# Patient Record
Sex: Female | Born: 1950 | Race: White | Hispanic: No | Marital: Married | Smoking: Never smoker
Health system: Southern US, Community
[De-identification: ages and names within clinical notes are randomized; demographics above are authoritative.]

## PROBLEM LIST (undated history)

## (undated) DIAGNOSIS — F325 Major depressive disorder, single episode, in full remission: Secondary | ICD-10-CM

## (undated) DIAGNOSIS — I8390 Asymptomatic varicose veins of unspecified lower extremity: Secondary | ICD-10-CM

## (undated) DIAGNOSIS — D219 Benign neoplasm of connective and other soft tissue, unspecified: Secondary | ICD-10-CM

## (undated) DIAGNOSIS — R748 Abnormal levels of other serum enzymes: Secondary | ICD-10-CM

## (undated) DIAGNOSIS — B351 Tinea unguium: Secondary | ICD-10-CM

## (undated) DIAGNOSIS — N80109 Endometriosis of ovary, unspecified side, unspecified depth: Secondary | ICD-10-CM

## (undated) DIAGNOSIS — N952 Postmenopausal atrophic vaginitis: Secondary | ICD-10-CM

## (undated) DIAGNOSIS — E7800 Pure hypercholesterolemia, unspecified: Secondary | ICD-10-CM

## (undated) DIAGNOSIS — N801 Endometriosis of ovary: Secondary | ICD-10-CM

## (undated) DIAGNOSIS — IMO0002 Reserved for concepts with insufficient information to code with codable children: Secondary | ICD-10-CM

## (undated) HISTORY — DX: Pure hypercholesterolemia, unspecified: E78.00

## (undated) HISTORY — DX: Major depressive disorder, single episode, in full remission: F32.5

## (undated) HISTORY — PX: OTHER SURGICAL HISTORY: SHX169

## (undated) HISTORY — DX: Endometriosis of ovary: N80.1

## (undated) HISTORY — PX: ABDOMINAL HYSTERECTOMY: SHX81

## (undated) HISTORY — PX: BREAST BIOPSY: SHX20

## (undated) HISTORY — PX: COLONOSCOPY W/ POLYPECTOMY: SHX1380

## (undated) HISTORY — DX: Postmenopausal atrophic vaginitis: N95.2

## (undated) HISTORY — DX: Benign neoplasm of connective and other soft tissue, unspecified: D21.9

## (undated) HISTORY — DX: Abnormal levels of other serum enzymes: R74.8

## (undated) HISTORY — DX: Reserved for concepts with insufficient information to code with codable children: IMO0002

## (undated) HISTORY — DX: Endometriosis of ovary, unspecified side, unspecified depth: N80.109

## (undated) HISTORY — DX: Tinea unguium: B35.1

---

## 1987-02-15 HISTORY — PX: BREAST EXCISIONAL BIOPSY: SUR124

## 1999-11-18 ENCOUNTER — Encounter: Payer: Self-pay | Admitting: Unknown Physician Specialty

## 1999-11-18 ENCOUNTER — Encounter: Admission: RE | Admit: 1999-11-18 | Discharge: 1999-11-18 | Payer: Self-pay | Admitting: Unknown Physician Specialty

## 2000-12-05 ENCOUNTER — Encounter: Payer: Self-pay | Admitting: Unknown Physician Specialty

## 2000-12-05 ENCOUNTER — Encounter: Admission: RE | Admit: 2000-12-05 | Discharge: 2000-12-05 | Payer: Self-pay | Admitting: Unknown Physician Specialty

## 2000-12-08 ENCOUNTER — Encounter: Admission: RE | Admit: 2000-12-08 | Discharge: 2000-12-08 | Payer: Self-pay | Admitting: Unknown Physician Specialty

## 2000-12-08 ENCOUNTER — Encounter: Payer: Self-pay | Admitting: Unknown Physician Specialty

## 2001-12-20 ENCOUNTER — Encounter: Admission: RE | Admit: 2001-12-20 | Discharge: 2001-12-20 | Payer: Self-pay | Admitting: Unknown Physician Specialty

## 2001-12-20 ENCOUNTER — Encounter: Payer: Self-pay | Admitting: Unknown Physician Specialty

## 2003-01-20 ENCOUNTER — Encounter: Admission: RE | Admit: 2003-01-20 | Discharge: 2003-01-20 | Payer: Self-pay | Admitting: Unknown Physician Specialty

## 2004-08-04 ENCOUNTER — Encounter: Admission: RE | Admit: 2004-08-04 | Discharge: 2004-08-04 | Payer: Self-pay | Admitting: Unknown Physician Specialty

## 2005-09-21 ENCOUNTER — Encounter: Admission: RE | Admit: 2005-09-21 | Discharge: 2005-09-21 | Payer: Self-pay | Admitting: Unknown Physician Specialty

## 2006-02-14 HISTORY — PX: BREAST CYST ASPIRATION: SHX578

## 2007-03-15 ENCOUNTER — Encounter: Admission: RE | Admit: 2007-03-15 | Discharge: 2007-03-15 | Payer: Self-pay | Admitting: Unknown Physician Specialty

## 2007-03-23 ENCOUNTER — Encounter: Admission: RE | Admit: 2007-03-23 | Discharge: 2007-03-23 | Payer: Self-pay | Admitting: Unknown Physician Specialty

## 2007-03-23 ENCOUNTER — Encounter (INDEPENDENT_AMBULATORY_CARE_PROVIDER_SITE_OTHER): Payer: Self-pay | Admitting: Diagnostic Radiology

## 2007-06-11 ENCOUNTER — Ambulatory Visit: Payer: Self-pay | Admitting: Unknown Physician Specialty

## 2007-10-03 ENCOUNTER — Encounter: Admission: RE | Admit: 2007-10-03 | Discharge: 2007-10-03 | Payer: Self-pay | Admitting: Unknown Physician Specialty

## 2008-01-12 ENCOUNTER — Emergency Department: Payer: Self-pay | Admitting: Emergency Medicine

## 2009-05-11 ENCOUNTER — Ambulatory Visit: Payer: Self-pay | Admitting: Family Medicine

## 2009-06-03 ENCOUNTER — Emergency Department: Payer: Self-pay | Admitting: Emergency Medicine

## 2010-04-28 ENCOUNTER — Ambulatory Visit: Payer: Self-pay | Admitting: Obstetrics and Gynecology

## 2011-06-07 ENCOUNTER — Ambulatory Visit: Payer: Self-pay | Admitting: Obstetrics and Gynecology

## 2011-09-28 ENCOUNTER — Ambulatory Visit: Payer: Self-pay | Admitting: Unknown Physician Specialty

## 2011-09-29 LAB — PATHOLOGY REPORT

## 2011-10-26 ENCOUNTER — Ambulatory Visit: Payer: Self-pay | Admitting: Unknown Physician Specialty

## 2012-02-15 HISTORY — PX: COLONOSCOPY: SHX174

## 2012-06-12 ENCOUNTER — Ambulatory Visit: Payer: Self-pay | Admitting: Obstetrics and Gynecology

## 2012-11-01 ENCOUNTER — Ambulatory Visit: Payer: Self-pay | Admitting: Unknown Physician Specialty

## 2013-01-04 ENCOUNTER — Ambulatory Visit: Payer: Self-pay | Admitting: Family Medicine

## 2013-07-02 ENCOUNTER — Ambulatory Visit: Payer: Self-pay | Admitting: Family Medicine

## 2013-10-14 DIAGNOSIS — M5137 Other intervertebral disc degeneration, lumbosacral region: Secondary | ICD-10-CM | POA: Diagnosis not present

## 2013-10-14 DIAGNOSIS — M999 Biomechanical lesion, unspecified: Secondary | ICD-10-CM | POA: Diagnosis not present

## 2013-10-25 DIAGNOSIS — M542 Cervicalgia: Secondary | ICD-10-CM | POA: Diagnosis not present

## 2013-10-25 DIAGNOSIS — M545 Low back pain, unspecified: Secondary | ICD-10-CM | POA: Diagnosis not present

## 2013-10-25 DIAGNOSIS — M999 Biomechanical lesion, unspecified: Secondary | ICD-10-CM | POA: Diagnosis not present

## 2013-10-25 DIAGNOSIS — M546 Pain in thoracic spine: Secondary | ICD-10-CM | POA: Diagnosis not present

## 2013-10-25 DIAGNOSIS — M9981 Other biomechanical lesions of cervical region: Secondary | ICD-10-CM | POA: Diagnosis not present

## 2013-10-28 DIAGNOSIS — M546 Pain in thoracic spine: Secondary | ICD-10-CM | POA: Diagnosis not present

## 2013-10-28 DIAGNOSIS — M545 Low back pain, unspecified: Secondary | ICD-10-CM | POA: Diagnosis not present

## 2013-10-28 DIAGNOSIS — M9981 Other biomechanical lesions of cervical region: Secondary | ICD-10-CM | POA: Diagnosis not present

## 2013-10-28 DIAGNOSIS — M999 Biomechanical lesion, unspecified: Secondary | ICD-10-CM | POA: Diagnosis not present

## 2013-10-28 DIAGNOSIS — M542 Cervicalgia: Secondary | ICD-10-CM | POA: Diagnosis not present

## 2013-10-30 DIAGNOSIS — M545 Low back pain, unspecified: Secondary | ICD-10-CM | POA: Diagnosis not present

## 2013-10-30 DIAGNOSIS — M542 Cervicalgia: Secondary | ICD-10-CM | POA: Diagnosis not present

## 2013-10-30 DIAGNOSIS — M546 Pain in thoracic spine: Secondary | ICD-10-CM | POA: Diagnosis not present

## 2013-10-30 DIAGNOSIS — M999 Biomechanical lesion, unspecified: Secondary | ICD-10-CM | POA: Diagnosis not present

## 2013-10-30 DIAGNOSIS — M9981 Other biomechanical lesions of cervical region: Secondary | ICD-10-CM | POA: Diagnosis not present

## 2013-11-04 DIAGNOSIS — M546 Pain in thoracic spine: Secondary | ICD-10-CM | POA: Diagnosis not present

## 2013-11-04 DIAGNOSIS — M999 Biomechanical lesion, unspecified: Secondary | ICD-10-CM | POA: Diagnosis not present

## 2013-11-04 DIAGNOSIS — M545 Low back pain, unspecified: Secondary | ICD-10-CM | POA: Diagnosis not present

## 2013-11-04 DIAGNOSIS — M9981 Other biomechanical lesions of cervical region: Secondary | ICD-10-CM | POA: Diagnosis not present

## 2013-11-04 DIAGNOSIS — M542 Cervicalgia: Secondary | ICD-10-CM | POA: Diagnosis not present

## 2013-11-06 DIAGNOSIS — M546 Pain in thoracic spine: Secondary | ICD-10-CM | POA: Diagnosis not present

## 2013-11-06 DIAGNOSIS — M999 Biomechanical lesion, unspecified: Secondary | ICD-10-CM | POA: Diagnosis not present

## 2013-11-06 DIAGNOSIS — M9981 Other biomechanical lesions of cervical region: Secondary | ICD-10-CM | POA: Diagnosis not present

## 2013-11-06 DIAGNOSIS — M545 Low back pain, unspecified: Secondary | ICD-10-CM | POA: Diagnosis not present

## 2013-11-06 DIAGNOSIS — M542 Cervicalgia: Secondary | ICD-10-CM | POA: Diagnosis not present

## 2013-11-11 DIAGNOSIS — M9981 Other biomechanical lesions of cervical region: Secondary | ICD-10-CM | POA: Diagnosis not present

## 2013-11-11 DIAGNOSIS — M999 Biomechanical lesion, unspecified: Secondary | ICD-10-CM | POA: Diagnosis not present

## 2013-11-11 DIAGNOSIS — M545 Low back pain, unspecified: Secondary | ICD-10-CM | POA: Diagnosis not present

## 2013-11-11 DIAGNOSIS — M542 Cervicalgia: Secondary | ICD-10-CM | POA: Diagnosis not present

## 2013-11-11 DIAGNOSIS — M546 Pain in thoracic spine: Secondary | ICD-10-CM | POA: Diagnosis not present

## 2013-11-13 DIAGNOSIS — M545 Low back pain, unspecified: Secondary | ICD-10-CM | POA: Diagnosis not present

## 2013-11-13 DIAGNOSIS — M999 Biomechanical lesion, unspecified: Secondary | ICD-10-CM | POA: Diagnosis not present

## 2013-11-13 DIAGNOSIS — M9981 Other biomechanical lesions of cervical region: Secondary | ICD-10-CM | POA: Diagnosis not present

## 2013-11-13 DIAGNOSIS — M546 Pain in thoracic spine: Secondary | ICD-10-CM | POA: Diagnosis not present

## 2013-11-13 DIAGNOSIS — M542 Cervicalgia: Secondary | ICD-10-CM | POA: Diagnosis not present

## 2013-11-18 DIAGNOSIS — M546 Pain in thoracic spine: Secondary | ICD-10-CM | POA: Diagnosis not present

## 2013-11-18 DIAGNOSIS — M9901 Segmental and somatic dysfunction of cervical region: Secondary | ICD-10-CM | POA: Diagnosis not present

## 2013-11-18 DIAGNOSIS — M9902 Segmental and somatic dysfunction of thoracic region: Secondary | ICD-10-CM | POA: Diagnosis not present

## 2013-11-18 DIAGNOSIS — M545 Low back pain: Secondary | ICD-10-CM | POA: Diagnosis not present

## 2013-11-18 DIAGNOSIS — M542 Cervicalgia: Secondary | ICD-10-CM | POA: Diagnosis not present

## 2013-11-18 DIAGNOSIS — M9903 Segmental and somatic dysfunction of lumbar region: Secondary | ICD-10-CM | POA: Diagnosis not present

## 2013-11-20 DIAGNOSIS — M546 Pain in thoracic spine: Secondary | ICD-10-CM | POA: Diagnosis not present

## 2013-11-20 DIAGNOSIS — M9901 Segmental and somatic dysfunction of cervical region: Secondary | ICD-10-CM | POA: Diagnosis not present

## 2013-11-20 DIAGNOSIS — M545 Low back pain: Secondary | ICD-10-CM | POA: Diagnosis not present

## 2013-11-20 DIAGNOSIS — M9903 Segmental and somatic dysfunction of lumbar region: Secondary | ICD-10-CM | POA: Diagnosis not present

## 2013-11-20 DIAGNOSIS — M542 Cervicalgia: Secondary | ICD-10-CM | POA: Diagnosis not present

## 2013-11-20 DIAGNOSIS — M9902 Segmental and somatic dysfunction of thoracic region: Secondary | ICD-10-CM | POA: Diagnosis not present

## 2013-11-22 DIAGNOSIS — M9903 Segmental and somatic dysfunction of lumbar region: Secondary | ICD-10-CM | POA: Diagnosis not present

## 2013-11-22 DIAGNOSIS — M545 Low back pain: Secondary | ICD-10-CM | POA: Diagnosis not present

## 2013-11-22 DIAGNOSIS — M546 Pain in thoracic spine: Secondary | ICD-10-CM | POA: Diagnosis not present

## 2013-11-22 DIAGNOSIS — M9901 Segmental and somatic dysfunction of cervical region: Secondary | ICD-10-CM | POA: Diagnosis not present

## 2013-11-22 DIAGNOSIS — M542 Cervicalgia: Secondary | ICD-10-CM | POA: Diagnosis not present

## 2013-11-22 DIAGNOSIS — M9902 Segmental and somatic dysfunction of thoracic region: Secondary | ICD-10-CM | POA: Diagnosis not present

## 2013-11-25 DIAGNOSIS — M9902 Segmental and somatic dysfunction of thoracic region: Secondary | ICD-10-CM | POA: Diagnosis not present

## 2013-11-25 DIAGNOSIS — M9903 Segmental and somatic dysfunction of lumbar region: Secondary | ICD-10-CM | POA: Diagnosis not present

## 2013-11-25 DIAGNOSIS — M545 Low back pain: Secondary | ICD-10-CM | POA: Diagnosis not present

## 2013-11-25 DIAGNOSIS — M546 Pain in thoracic spine: Secondary | ICD-10-CM | POA: Diagnosis not present

## 2013-11-25 DIAGNOSIS — M9901 Segmental and somatic dysfunction of cervical region: Secondary | ICD-10-CM | POA: Diagnosis not present

## 2013-11-25 DIAGNOSIS — M542 Cervicalgia: Secondary | ICD-10-CM | POA: Diagnosis not present

## 2013-11-29 DIAGNOSIS — M545 Low back pain: Secondary | ICD-10-CM | POA: Diagnosis not present

## 2013-11-29 DIAGNOSIS — M546 Pain in thoracic spine: Secondary | ICD-10-CM | POA: Diagnosis not present

## 2013-11-29 DIAGNOSIS — M9901 Segmental and somatic dysfunction of cervical region: Secondary | ICD-10-CM | POA: Diagnosis not present

## 2013-11-29 DIAGNOSIS — M9903 Segmental and somatic dysfunction of lumbar region: Secondary | ICD-10-CM | POA: Diagnosis not present

## 2013-11-29 DIAGNOSIS — M542 Cervicalgia: Secondary | ICD-10-CM | POA: Diagnosis not present

## 2013-11-29 DIAGNOSIS — M9902 Segmental and somatic dysfunction of thoracic region: Secondary | ICD-10-CM | POA: Diagnosis not present

## 2013-12-02 DIAGNOSIS — M542 Cervicalgia: Secondary | ICD-10-CM | POA: Diagnosis not present

## 2013-12-02 DIAGNOSIS — M9901 Segmental and somatic dysfunction of cervical region: Secondary | ICD-10-CM | POA: Diagnosis not present

## 2013-12-02 DIAGNOSIS — M9903 Segmental and somatic dysfunction of lumbar region: Secondary | ICD-10-CM | POA: Diagnosis not present

## 2013-12-02 DIAGNOSIS — M9902 Segmental and somatic dysfunction of thoracic region: Secondary | ICD-10-CM | POA: Diagnosis not present

## 2013-12-02 DIAGNOSIS — M545 Low back pain: Secondary | ICD-10-CM | POA: Diagnosis not present

## 2013-12-02 DIAGNOSIS — M546 Pain in thoracic spine: Secondary | ICD-10-CM | POA: Diagnosis not present

## 2013-12-04 DIAGNOSIS — M9902 Segmental and somatic dysfunction of thoracic region: Secondary | ICD-10-CM | POA: Diagnosis not present

## 2013-12-04 DIAGNOSIS — M545 Low back pain: Secondary | ICD-10-CM | POA: Diagnosis not present

## 2013-12-04 DIAGNOSIS — M9903 Segmental and somatic dysfunction of lumbar region: Secondary | ICD-10-CM | POA: Diagnosis not present

## 2013-12-04 DIAGNOSIS — M546 Pain in thoracic spine: Secondary | ICD-10-CM | POA: Diagnosis not present

## 2013-12-04 DIAGNOSIS — M9901 Segmental and somatic dysfunction of cervical region: Secondary | ICD-10-CM | POA: Diagnosis not present

## 2013-12-04 DIAGNOSIS — M542 Cervicalgia: Secondary | ICD-10-CM | POA: Diagnosis not present

## 2013-12-09 DIAGNOSIS — M9902 Segmental and somatic dysfunction of thoracic region: Secondary | ICD-10-CM | POA: Diagnosis not present

## 2013-12-09 DIAGNOSIS — M9903 Segmental and somatic dysfunction of lumbar region: Secondary | ICD-10-CM | POA: Diagnosis not present

## 2013-12-09 DIAGNOSIS — M542 Cervicalgia: Secondary | ICD-10-CM | POA: Diagnosis not present

## 2013-12-09 DIAGNOSIS — M546 Pain in thoracic spine: Secondary | ICD-10-CM | POA: Diagnosis not present

## 2013-12-09 DIAGNOSIS — M9901 Segmental and somatic dysfunction of cervical region: Secondary | ICD-10-CM | POA: Diagnosis not present

## 2013-12-09 DIAGNOSIS — M545 Low back pain: Secondary | ICD-10-CM | POA: Diagnosis not present

## 2013-12-11 DIAGNOSIS — M9902 Segmental and somatic dysfunction of thoracic region: Secondary | ICD-10-CM | POA: Diagnosis not present

## 2013-12-11 DIAGNOSIS — M546 Pain in thoracic spine: Secondary | ICD-10-CM | POA: Diagnosis not present

## 2013-12-11 DIAGNOSIS — M542 Cervicalgia: Secondary | ICD-10-CM | POA: Diagnosis not present

## 2013-12-11 DIAGNOSIS — M545 Low back pain: Secondary | ICD-10-CM | POA: Diagnosis not present

## 2013-12-11 DIAGNOSIS — M9901 Segmental and somatic dysfunction of cervical region: Secondary | ICD-10-CM | POA: Diagnosis not present

## 2013-12-11 DIAGNOSIS — M9903 Segmental and somatic dysfunction of lumbar region: Secondary | ICD-10-CM | POA: Diagnosis not present

## 2013-12-13 DIAGNOSIS — M9902 Segmental and somatic dysfunction of thoracic region: Secondary | ICD-10-CM | POA: Diagnosis not present

## 2013-12-13 DIAGNOSIS — M9903 Segmental and somatic dysfunction of lumbar region: Secondary | ICD-10-CM | POA: Diagnosis not present

## 2013-12-13 DIAGNOSIS — M542 Cervicalgia: Secondary | ICD-10-CM | POA: Diagnosis not present

## 2013-12-13 DIAGNOSIS — M545 Low back pain: Secondary | ICD-10-CM | POA: Diagnosis not present

## 2013-12-13 DIAGNOSIS — M9901 Segmental and somatic dysfunction of cervical region: Secondary | ICD-10-CM | POA: Diagnosis not present

## 2013-12-13 DIAGNOSIS — M546 Pain in thoracic spine: Secondary | ICD-10-CM | POA: Diagnosis not present

## 2013-12-16 DIAGNOSIS — M542 Cervicalgia: Secondary | ICD-10-CM | POA: Diagnosis not present

## 2013-12-16 DIAGNOSIS — M9901 Segmental and somatic dysfunction of cervical region: Secondary | ICD-10-CM | POA: Diagnosis not present

## 2013-12-16 DIAGNOSIS — M545 Low back pain: Secondary | ICD-10-CM | POA: Diagnosis not present

## 2013-12-16 DIAGNOSIS — M9903 Segmental and somatic dysfunction of lumbar region: Secondary | ICD-10-CM | POA: Diagnosis not present

## 2013-12-16 DIAGNOSIS — M546 Pain in thoracic spine: Secondary | ICD-10-CM | POA: Diagnosis not present

## 2013-12-16 DIAGNOSIS — M9902 Segmental and somatic dysfunction of thoracic region: Secondary | ICD-10-CM | POA: Diagnosis not present

## 2013-12-20 DIAGNOSIS — M9902 Segmental and somatic dysfunction of thoracic region: Secondary | ICD-10-CM | POA: Diagnosis not present

## 2013-12-20 DIAGNOSIS — M542 Cervicalgia: Secondary | ICD-10-CM | POA: Diagnosis not present

## 2013-12-20 DIAGNOSIS — M546 Pain in thoracic spine: Secondary | ICD-10-CM | POA: Diagnosis not present

## 2013-12-20 DIAGNOSIS — M9901 Segmental and somatic dysfunction of cervical region: Secondary | ICD-10-CM | POA: Diagnosis not present

## 2013-12-20 DIAGNOSIS — M545 Low back pain: Secondary | ICD-10-CM | POA: Diagnosis not present

## 2013-12-20 DIAGNOSIS — M9903 Segmental and somatic dysfunction of lumbar region: Secondary | ICD-10-CM | POA: Diagnosis not present

## 2013-12-24 DIAGNOSIS — M9903 Segmental and somatic dysfunction of lumbar region: Secondary | ICD-10-CM | POA: Diagnosis not present

## 2013-12-24 DIAGNOSIS — M9901 Segmental and somatic dysfunction of cervical region: Secondary | ICD-10-CM | POA: Diagnosis not present

## 2013-12-24 DIAGNOSIS — M545 Low back pain: Secondary | ICD-10-CM | POA: Diagnosis not present

## 2013-12-24 DIAGNOSIS — M546 Pain in thoracic spine: Secondary | ICD-10-CM | POA: Diagnosis not present

## 2013-12-24 DIAGNOSIS — M9902 Segmental and somatic dysfunction of thoracic region: Secondary | ICD-10-CM | POA: Diagnosis not present

## 2013-12-24 DIAGNOSIS — M542 Cervicalgia: Secondary | ICD-10-CM | POA: Diagnosis not present

## 2013-12-27 DIAGNOSIS — M542 Cervicalgia: Secondary | ICD-10-CM | POA: Diagnosis not present

## 2013-12-27 DIAGNOSIS — M9903 Segmental and somatic dysfunction of lumbar region: Secondary | ICD-10-CM | POA: Diagnosis not present

## 2013-12-27 DIAGNOSIS — M9901 Segmental and somatic dysfunction of cervical region: Secondary | ICD-10-CM | POA: Diagnosis not present

## 2013-12-27 DIAGNOSIS — M9902 Segmental and somatic dysfunction of thoracic region: Secondary | ICD-10-CM | POA: Diagnosis not present

## 2013-12-27 DIAGNOSIS — M546 Pain in thoracic spine: Secondary | ICD-10-CM | POA: Diagnosis not present

## 2013-12-27 DIAGNOSIS — M545 Low back pain: Secondary | ICD-10-CM | POA: Diagnosis not present

## 2013-12-31 DIAGNOSIS — M546 Pain in thoracic spine: Secondary | ICD-10-CM | POA: Diagnosis not present

## 2013-12-31 DIAGNOSIS — M545 Low back pain: Secondary | ICD-10-CM | POA: Diagnosis not present

## 2013-12-31 DIAGNOSIS — M9902 Segmental and somatic dysfunction of thoracic region: Secondary | ICD-10-CM | POA: Diagnosis not present

## 2013-12-31 DIAGNOSIS — M542 Cervicalgia: Secondary | ICD-10-CM | POA: Diagnosis not present

## 2013-12-31 DIAGNOSIS — M9903 Segmental and somatic dysfunction of lumbar region: Secondary | ICD-10-CM | POA: Diagnosis not present

## 2013-12-31 DIAGNOSIS — M9901 Segmental and somatic dysfunction of cervical region: Secondary | ICD-10-CM | POA: Diagnosis not present

## 2014-02-20 DIAGNOSIS — Z23 Encounter for immunization: Secondary | ICD-10-CM | POA: Diagnosis not present

## 2014-03-04 DIAGNOSIS — M545 Low back pain: Secondary | ICD-10-CM | POA: Diagnosis not present

## 2014-03-04 DIAGNOSIS — M9903 Segmental and somatic dysfunction of lumbar region: Secondary | ICD-10-CM | POA: Diagnosis not present

## 2014-03-04 DIAGNOSIS — M9901 Segmental and somatic dysfunction of cervical region: Secondary | ICD-10-CM | POA: Diagnosis not present

## 2014-03-04 DIAGNOSIS — M542 Cervicalgia: Secondary | ICD-10-CM | POA: Diagnosis not present

## 2014-03-04 DIAGNOSIS — M9902 Segmental and somatic dysfunction of thoracic region: Secondary | ICD-10-CM | POA: Diagnosis not present

## 2014-03-04 DIAGNOSIS — M546 Pain in thoracic spine: Secondary | ICD-10-CM | POA: Diagnosis not present

## 2014-03-25 DIAGNOSIS — M9901 Segmental and somatic dysfunction of cervical region: Secondary | ICD-10-CM | POA: Diagnosis not present

## 2014-03-25 DIAGNOSIS — M9902 Segmental and somatic dysfunction of thoracic region: Secondary | ICD-10-CM | POA: Diagnosis not present

## 2014-03-25 DIAGNOSIS — M9903 Segmental and somatic dysfunction of lumbar region: Secondary | ICD-10-CM | POA: Diagnosis not present

## 2014-03-25 DIAGNOSIS — M546 Pain in thoracic spine: Secondary | ICD-10-CM | POA: Diagnosis not present

## 2014-03-25 DIAGNOSIS — M545 Low back pain: Secondary | ICD-10-CM | POA: Diagnosis not present

## 2014-03-25 DIAGNOSIS — M542 Cervicalgia: Secondary | ICD-10-CM | POA: Diagnosis not present

## 2014-03-27 DIAGNOSIS — L609 Nail disorder, unspecified: Secondary | ICD-10-CM | POA: Diagnosis not present

## 2014-03-27 DIAGNOSIS — R748 Abnormal levels of other serum enzymes: Secondary | ICD-10-CM | POA: Diagnosis not present

## 2014-03-27 DIAGNOSIS — K824 Cholesterolosis of gallbladder: Secondary | ICD-10-CM | POA: Diagnosis not present

## 2014-03-27 DIAGNOSIS — B354 Tinea corporis: Secondary | ICD-10-CM | POA: Diagnosis not present

## 2014-03-27 DIAGNOSIS — B351 Tinea unguium: Secondary | ICD-10-CM | POA: Diagnosis not present

## 2014-03-27 DIAGNOSIS — E780 Pure hypercholesterolemia: Secondary | ICD-10-CM | POA: Diagnosis not present

## 2014-03-27 DIAGNOSIS — R1907 Generalized intra-abdominal and pelvic swelling, mass and lump: Secondary | ICD-10-CM | POA: Diagnosis not present

## 2014-04-14 DIAGNOSIS — M9902 Segmental and somatic dysfunction of thoracic region: Secondary | ICD-10-CM | POA: Diagnosis not present

## 2014-04-14 DIAGNOSIS — M546 Pain in thoracic spine: Secondary | ICD-10-CM | POA: Diagnosis not present

## 2014-04-14 DIAGNOSIS — M545 Low back pain: Secondary | ICD-10-CM | POA: Diagnosis not present

## 2014-04-14 DIAGNOSIS — M9901 Segmental and somatic dysfunction of cervical region: Secondary | ICD-10-CM | POA: Diagnosis not present

## 2014-04-14 DIAGNOSIS — M542 Cervicalgia: Secondary | ICD-10-CM | POA: Diagnosis not present

## 2014-04-14 DIAGNOSIS — M9903 Segmental and somatic dysfunction of lumbar region: Secondary | ICD-10-CM | POA: Diagnosis not present

## 2014-04-15 ENCOUNTER — Ambulatory Visit: Payer: Self-pay | Admitting: Family Medicine

## 2014-04-15 DIAGNOSIS — K253 Acute gastric ulcer without hemorrhage or perforation: Secondary | ICD-10-CM | POA: Diagnosis not present

## 2014-04-15 DIAGNOSIS — I728 Aneurysm of other specified arteries: Secondary | ICD-10-CM | POA: Diagnosis not present

## 2014-04-15 DIAGNOSIS — R1907 Generalized intra-abdominal and pelvic swelling, mass and lump: Secondary | ICD-10-CM | POA: Diagnosis not present

## 2014-04-15 DIAGNOSIS — N200 Calculus of kidney: Secondary | ICD-10-CM | POA: Diagnosis not present

## 2014-04-18 DIAGNOSIS — M542 Cervicalgia: Secondary | ICD-10-CM | POA: Diagnosis not present

## 2014-04-18 DIAGNOSIS — M545 Low back pain: Secondary | ICD-10-CM | POA: Diagnosis not present

## 2014-04-18 DIAGNOSIS — M9903 Segmental and somatic dysfunction of lumbar region: Secondary | ICD-10-CM | POA: Diagnosis not present

## 2014-04-18 DIAGNOSIS — M9901 Segmental and somatic dysfunction of cervical region: Secondary | ICD-10-CM | POA: Diagnosis not present

## 2014-04-18 DIAGNOSIS — M9902 Segmental and somatic dysfunction of thoracic region: Secondary | ICD-10-CM | POA: Diagnosis not present

## 2014-04-18 DIAGNOSIS — M546 Pain in thoracic spine: Secondary | ICD-10-CM | POA: Diagnosis not present

## 2014-04-21 DIAGNOSIS — M545 Low back pain: Secondary | ICD-10-CM | POA: Diagnosis not present

## 2014-04-21 DIAGNOSIS — M9903 Segmental and somatic dysfunction of lumbar region: Secondary | ICD-10-CM | POA: Diagnosis not present

## 2014-04-21 DIAGNOSIS — M9901 Segmental and somatic dysfunction of cervical region: Secondary | ICD-10-CM | POA: Diagnosis not present

## 2014-04-21 DIAGNOSIS — M546 Pain in thoracic spine: Secondary | ICD-10-CM | POA: Diagnosis not present

## 2014-04-21 DIAGNOSIS — M542 Cervicalgia: Secondary | ICD-10-CM | POA: Diagnosis not present

## 2014-04-21 DIAGNOSIS — M9902 Segmental and somatic dysfunction of thoracic region: Secondary | ICD-10-CM | POA: Diagnosis not present

## 2014-04-28 DIAGNOSIS — M545 Low back pain: Secondary | ICD-10-CM | POA: Diagnosis not present

## 2014-04-28 DIAGNOSIS — M542 Cervicalgia: Secondary | ICD-10-CM | POA: Diagnosis not present

## 2014-04-28 DIAGNOSIS — M9902 Segmental and somatic dysfunction of thoracic region: Secondary | ICD-10-CM | POA: Diagnosis not present

## 2014-04-28 DIAGNOSIS — M9903 Segmental and somatic dysfunction of lumbar region: Secondary | ICD-10-CM | POA: Diagnosis not present

## 2014-04-28 DIAGNOSIS — M546 Pain in thoracic spine: Secondary | ICD-10-CM | POA: Diagnosis not present

## 2014-04-28 DIAGNOSIS — M9901 Segmental and somatic dysfunction of cervical region: Secondary | ICD-10-CM | POA: Diagnosis not present

## 2014-04-29 DIAGNOSIS — B351 Tinea unguium: Secondary | ICD-10-CM | POA: Diagnosis not present

## 2014-04-30 DIAGNOSIS — M9903 Segmental and somatic dysfunction of lumbar region: Secondary | ICD-10-CM | POA: Diagnosis not present

## 2014-04-30 DIAGNOSIS — M9902 Segmental and somatic dysfunction of thoracic region: Secondary | ICD-10-CM | POA: Diagnosis not present

## 2014-04-30 DIAGNOSIS — M545 Low back pain: Secondary | ICD-10-CM | POA: Diagnosis not present

## 2014-04-30 DIAGNOSIS — M546 Pain in thoracic spine: Secondary | ICD-10-CM | POA: Diagnosis not present

## 2014-04-30 DIAGNOSIS — M9901 Segmental and somatic dysfunction of cervical region: Secondary | ICD-10-CM | POA: Diagnosis not present

## 2014-04-30 DIAGNOSIS — M542 Cervicalgia: Secondary | ICD-10-CM | POA: Diagnosis not present

## 2014-05-07 DIAGNOSIS — M546 Pain in thoracic spine: Secondary | ICD-10-CM | POA: Diagnosis not present

## 2014-05-07 DIAGNOSIS — M9902 Segmental and somatic dysfunction of thoracic region: Secondary | ICD-10-CM | POA: Diagnosis not present

## 2014-05-07 DIAGNOSIS — M9901 Segmental and somatic dysfunction of cervical region: Secondary | ICD-10-CM | POA: Diagnosis not present

## 2014-05-07 DIAGNOSIS — M9903 Segmental and somatic dysfunction of lumbar region: Secondary | ICD-10-CM | POA: Diagnosis not present

## 2014-05-07 DIAGNOSIS — M542 Cervicalgia: Secondary | ICD-10-CM | POA: Diagnosis not present

## 2014-05-07 DIAGNOSIS — M545 Low back pain: Secondary | ICD-10-CM | POA: Diagnosis not present

## 2014-05-13 DIAGNOSIS — M9903 Segmental and somatic dysfunction of lumbar region: Secondary | ICD-10-CM | POA: Diagnosis not present

## 2014-05-13 DIAGNOSIS — M9901 Segmental and somatic dysfunction of cervical region: Secondary | ICD-10-CM | POA: Diagnosis not present

## 2014-05-13 DIAGNOSIS — M9902 Segmental and somatic dysfunction of thoracic region: Secondary | ICD-10-CM | POA: Diagnosis not present

## 2014-05-13 DIAGNOSIS — M542 Cervicalgia: Secondary | ICD-10-CM | POA: Diagnosis not present

## 2014-05-13 DIAGNOSIS — M546 Pain in thoracic spine: Secondary | ICD-10-CM | POA: Diagnosis not present

## 2014-05-13 DIAGNOSIS — M545 Low back pain: Secondary | ICD-10-CM | POA: Diagnosis not present

## 2014-05-19 DIAGNOSIS — M9903 Segmental and somatic dysfunction of lumbar region: Secondary | ICD-10-CM | POA: Diagnosis not present

## 2014-05-19 DIAGNOSIS — M545 Low back pain: Secondary | ICD-10-CM | POA: Diagnosis not present

## 2014-05-19 DIAGNOSIS — M546 Pain in thoracic spine: Secondary | ICD-10-CM | POA: Diagnosis not present

## 2014-05-19 DIAGNOSIS — M542 Cervicalgia: Secondary | ICD-10-CM | POA: Diagnosis not present

## 2014-05-19 DIAGNOSIS — M9901 Segmental and somatic dysfunction of cervical region: Secondary | ICD-10-CM | POA: Diagnosis not present

## 2014-05-19 DIAGNOSIS — M9902 Segmental and somatic dysfunction of thoracic region: Secondary | ICD-10-CM | POA: Diagnosis not present

## 2014-05-23 DIAGNOSIS — M9903 Segmental and somatic dysfunction of lumbar region: Secondary | ICD-10-CM | POA: Diagnosis not present

## 2014-05-23 DIAGNOSIS — M545 Low back pain: Secondary | ICD-10-CM | POA: Diagnosis not present

## 2014-05-23 DIAGNOSIS — M9902 Segmental and somatic dysfunction of thoracic region: Secondary | ICD-10-CM | POA: Diagnosis not present

## 2014-05-23 DIAGNOSIS — M546 Pain in thoracic spine: Secondary | ICD-10-CM | POA: Diagnosis not present

## 2014-05-23 DIAGNOSIS — M9901 Segmental and somatic dysfunction of cervical region: Secondary | ICD-10-CM | POA: Diagnosis not present

## 2014-05-23 DIAGNOSIS — M542 Cervicalgia: Secondary | ICD-10-CM | POA: Diagnosis not present

## 2014-05-30 DIAGNOSIS — M9901 Segmental and somatic dysfunction of cervical region: Secondary | ICD-10-CM | POA: Diagnosis not present

## 2014-05-30 DIAGNOSIS — M546 Pain in thoracic spine: Secondary | ICD-10-CM | POA: Diagnosis not present

## 2014-05-30 DIAGNOSIS — M542 Cervicalgia: Secondary | ICD-10-CM | POA: Diagnosis not present

## 2014-05-30 DIAGNOSIS — M9902 Segmental and somatic dysfunction of thoracic region: Secondary | ICD-10-CM | POA: Diagnosis not present

## 2014-05-30 DIAGNOSIS — M545 Low back pain: Secondary | ICD-10-CM | POA: Diagnosis not present

## 2014-05-30 DIAGNOSIS — M9903 Segmental and somatic dysfunction of lumbar region: Secondary | ICD-10-CM | POA: Diagnosis not present

## 2014-06-09 DIAGNOSIS — M9902 Segmental and somatic dysfunction of thoracic region: Secondary | ICD-10-CM | POA: Diagnosis not present

## 2014-06-09 DIAGNOSIS — M545 Low back pain: Secondary | ICD-10-CM | POA: Diagnosis not present

## 2014-06-09 DIAGNOSIS — M9903 Segmental and somatic dysfunction of lumbar region: Secondary | ICD-10-CM | POA: Diagnosis not present

## 2014-06-09 DIAGNOSIS — Z79899 Other long term (current) drug therapy: Secondary | ICD-10-CM | POA: Diagnosis not present

## 2014-06-09 DIAGNOSIS — M546 Pain in thoracic spine: Secondary | ICD-10-CM | POA: Diagnosis not present

## 2014-06-09 DIAGNOSIS — M9901 Segmental and somatic dysfunction of cervical region: Secondary | ICD-10-CM | POA: Diagnosis not present

## 2014-06-09 DIAGNOSIS — M542 Cervicalgia: Secondary | ICD-10-CM | POA: Diagnosis not present

## 2014-06-10 DIAGNOSIS — N390 Urinary tract infection, site not specified: Secondary | ICD-10-CM | POA: Diagnosis not present

## 2014-06-16 DIAGNOSIS — M9901 Segmental and somatic dysfunction of cervical region: Secondary | ICD-10-CM | POA: Diagnosis not present

## 2014-06-16 DIAGNOSIS — M542 Cervicalgia: Secondary | ICD-10-CM | POA: Diagnosis not present

## 2014-06-16 DIAGNOSIS — M546 Pain in thoracic spine: Secondary | ICD-10-CM | POA: Diagnosis not present

## 2014-06-16 DIAGNOSIS — M9902 Segmental and somatic dysfunction of thoracic region: Secondary | ICD-10-CM | POA: Diagnosis not present

## 2014-06-16 DIAGNOSIS — M9903 Segmental and somatic dysfunction of lumbar region: Secondary | ICD-10-CM | POA: Diagnosis not present

## 2014-06-16 DIAGNOSIS — M545 Low back pain: Secondary | ICD-10-CM | POA: Diagnosis not present

## 2014-06-23 DIAGNOSIS — M546 Pain in thoracic spine: Secondary | ICD-10-CM | POA: Diagnosis not present

## 2014-06-23 DIAGNOSIS — M9902 Segmental and somatic dysfunction of thoracic region: Secondary | ICD-10-CM | POA: Diagnosis not present

## 2014-06-23 DIAGNOSIS — M542 Cervicalgia: Secondary | ICD-10-CM | POA: Diagnosis not present

## 2014-06-23 DIAGNOSIS — M545 Low back pain: Secondary | ICD-10-CM | POA: Diagnosis not present

## 2014-06-23 DIAGNOSIS — M9901 Segmental and somatic dysfunction of cervical region: Secondary | ICD-10-CM | POA: Diagnosis not present

## 2014-06-23 DIAGNOSIS — M9903 Segmental and somatic dysfunction of lumbar region: Secondary | ICD-10-CM | POA: Diagnosis not present

## 2014-06-30 DIAGNOSIS — M542 Cervicalgia: Secondary | ICD-10-CM | POA: Diagnosis not present

## 2014-06-30 DIAGNOSIS — M545 Low back pain: Secondary | ICD-10-CM | POA: Diagnosis not present

## 2014-06-30 DIAGNOSIS — M9903 Segmental and somatic dysfunction of lumbar region: Secondary | ICD-10-CM | POA: Diagnosis not present

## 2014-06-30 DIAGNOSIS — M9902 Segmental and somatic dysfunction of thoracic region: Secondary | ICD-10-CM | POA: Diagnosis not present

## 2014-06-30 DIAGNOSIS — M546 Pain in thoracic spine: Secondary | ICD-10-CM | POA: Diagnosis not present

## 2014-06-30 DIAGNOSIS — M9901 Segmental and somatic dysfunction of cervical region: Secondary | ICD-10-CM | POA: Diagnosis not present

## 2014-07-07 DIAGNOSIS — M9901 Segmental and somatic dysfunction of cervical region: Secondary | ICD-10-CM | POA: Diagnosis not present

## 2014-07-07 DIAGNOSIS — M546 Pain in thoracic spine: Secondary | ICD-10-CM | POA: Diagnosis not present

## 2014-07-07 DIAGNOSIS — M545 Low back pain: Secondary | ICD-10-CM | POA: Diagnosis not present

## 2014-07-07 DIAGNOSIS — M9902 Segmental and somatic dysfunction of thoracic region: Secondary | ICD-10-CM | POA: Diagnosis not present

## 2014-07-07 DIAGNOSIS — M542 Cervicalgia: Secondary | ICD-10-CM | POA: Diagnosis not present

## 2014-07-07 DIAGNOSIS — M9903 Segmental and somatic dysfunction of lumbar region: Secondary | ICD-10-CM | POA: Diagnosis not present

## 2014-07-16 DIAGNOSIS — M545 Low back pain: Secondary | ICD-10-CM | POA: Diagnosis not present

## 2014-07-16 DIAGNOSIS — M9903 Segmental and somatic dysfunction of lumbar region: Secondary | ICD-10-CM | POA: Diagnosis not present

## 2014-07-16 DIAGNOSIS — M9901 Segmental and somatic dysfunction of cervical region: Secondary | ICD-10-CM | POA: Diagnosis not present

## 2014-07-16 DIAGNOSIS — M9902 Segmental and somatic dysfunction of thoracic region: Secondary | ICD-10-CM | POA: Diagnosis not present

## 2014-07-16 DIAGNOSIS — M542 Cervicalgia: Secondary | ICD-10-CM | POA: Diagnosis not present

## 2014-07-16 DIAGNOSIS — M546 Pain in thoracic spine: Secondary | ICD-10-CM | POA: Diagnosis not present

## 2014-07-17 ENCOUNTER — Other Ambulatory Visit: Payer: Self-pay | Admitting: Family Medicine

## 2014-07-17 ENCOUNTER — Encounter: Payer: Medicare Other | Admitting: Family Medicine

## 2014-07-17 DIAGNOSIS — Z1231 Encounter for screening mammogram for malignant neoplasm of breast: Secondary | ICD-10-CM

## 2014-07-17 NOTE — Progress Notes (Signed)
This encounter was created in error - please disregard.

## 2014-07-23 ENCOUNTER — Ambulatory Visit
Admission: RE | Admit: 2014-07-23 | Discharge: 2014-07-23 | Disposition: A | Discharge status: Home / Self Care | Payer: Medicare Other | Source: Ambulatory Visit | Attending: Family Medicine | Admitting: Family Medicine

## 2014-07-23 ENCOUNTER — Other Ambulatory Visit: Payer: Self-pay | Admitting: Family Medicine

## 2014-07-23 DIAGNOSIS — Z1231 Encounter for screening mammogram for malignant neoplasm of breast: Secondary | ICD-10-CM

## 2014-07-25 ENCOUNTER — Telehealth: Payer: Self-pay

## 2014-07-25 DIAGNOSIS — H2513 Age-related nuclear cataract, bilateral: Secondary | ICD-10-CM | POA: Diagnosis not present

## 2014-07-25 MED ORDER — FLUOXETINE HCL (PMDD) 10 MG PO TABS: 1.0000 | ORAL_TABLET | Freq: Every day | ORAL | Status: DC

## 2014-07-25 NOTE — Telephone Encounter (Signed)
Have her take 10 mg daily until I see her; make sure she has appt in the next week I put in new rx but there is no pharmacy; please send Seek immediate care if she feels overly depressed, has hallucinations, or develops dark thoughts or thoughts of hurting herself She was supposed to be seen June 2nd per the note in Programme researcher, broadcasting/film/video

## 2014-07-25 NOTE — Telephone Encounter (Signed)
Since starting back on the Fluoxetine, she is having side effects. Can't sleep, nervous, loss of appetite. She wants to know if she maybe should have started back at 10mg  instead of 75m?

## 2014-07-25 NOTE — Telephone Encounter (Signed)
Left detailed message for patient.

## 2014-07-30 DIAGNOSIS — M9903 Segmental and somatic dysfunction of lumbar region: Secondary | ICD-10-CM | POA: Diagnosis not present

## 2014-07-30 DIAGNOSIS — M542 Cervicalgia: Secondary | ICD-10-CM | POA: Diagnosis not present

## 2014-07-30 DIAGNOSIS — M9901 Segmental and somatic dysfunction of cervical region: Secondary | ICD-10-CM | POA: Diagnosis not present

## 2014-07-30 DIAGNOSIS — M545 Low back pain: Secondary | ICD-10-CM | POA: Diagnosis not present

## 2014-07-30 DIAGNOSIS — M9902 Segmental and somatic dysfunction of thoracic region: Secondary | ICD-10-CM | POA: Diagnosis not present

## 2014-07-30 DIAGNOSIS — M546 Pain in thoracic spine: Secondary | ICD-10-CM | POA: Diagnosis not present

## 2014-08-01 ENCOUNTER — Encounter: Payer: Self-pay | Admitting: Family Medicine

## 2014-08-01 ENCOUNTER — Ambulatory Visit (INDEPENDENT_AMBULATORY_CARE_PROVIDER_SITE_OTHER): Payer: Medicare Other | Admitting: Family Medicine

## 2014-08-01 VITALS — BP 148/79 | HR 87 | Temp 98.6°F | Ht 66.0 in | Wt 122.0 lb

## 2014-08-01 DIAGNOSIS — R1084 Generalized abdominal pain: Secondary | ICD-10-CM

## 2014-08-01 DIAGNOSIS — E7800 Pure hypercholesterolemia, unspecified: Secondary | ICD-10-CM | POA: Insufficient documentation

## 2014-08-01 DIAGNOSIS — F330 Major depressive disorder, recurrent, mild: Secondary | ICD-10-CM | POA: Diagnosis not present

## 2014-08-01 DIAGNOSIS — R634 Abnormal weight loss: Secondary | ICD-10-CM

## 2014-08-01 DIAGNOSIS — D7389 Other diseases of spleen: Secondary | ICD-10-CM

## 2014-08-01 MED ORDER — SERTRALINE HCL 50 MG PO TABS: 25.0000 mg | ORAL_TABLET | Freq: Every day | ORAL | Status: DC

## 2014-08-01 NOTE — Assessment & Plan Note (Signed)
On 20 mg fluoxetine for years, then off, then started back; now anxious; no hx of bipolar d/o; stop fluoxetine; start 25 mg sertraline, call before next appt if needed; return in 4-5 weeks

## 2014-08-01 NOTE — Addendum Note (Signed)
Addended byWende Mott J on: 08/01/2014 09:06 AM   Modules accepted: Orders, Medications, SmartSet

## 2014-08-01 NOTE — Assessment & Plan Note (Signed)
She has made changes in her diet and we'll check lipids and see how numbers compare

## 2014-08-01 NOTE — Assessment & Plan Note (Addendum)
rescan in early March 2017; discovered on CT scan earlier this year; I spoke with vascular doctor

## 2014-08-01 NOTE — Progress Notes (Signed)
BP 148/79 mmHg  Pulse 87  Temp(Src) 98.6 F (37 C)  Ht 5\' 6"  (1.676 m)  Wt 122 lb (55.339 kg)  BMI 19.70 kg/m2  SpO2 99%   Subjective:    Patient ID: Shannon Weber, female    DOB: [birth year], 65 y.o.   MRN: [id number]  HPI: Shannon Weber is a 64 y.o. female  Chief Complaint  Patient presents with  . Hyperlipidemia  . Depression    Patient is here for depression She had been on 20 mg of fluoxetine for years and then had come off, thinking she was going to be okay without it; off for about 3 months and then started to feel down again; no thoughts of hurting herself; back on 20 mg but was feeling like she was going to jump out of her skin, no appetite at all; almost like prednisone; still has some of that; dry mouth; she has been on 10 mg now for a few weeks; feels better, but different feeling; BP is always good and her BP is up; she has never been diagnosed with bipolar d/o; sleep is not okay; she wasn't sleep much at all when taking 20 mg and taking Benadryl to help sleep at night Depression screen [facility] 2/9 08/01/2014  Decreased Interest 1  Down, Depressed, Hopeless 0  PHQ - 2 Score 1   High cholesterol Runs in the family; she has never been on medicine and does not want to She went more toward vegetarian lifestyle; used to eat a lot of cheese and pretty much eliminated that; very little lean meat; lots of spinach, quinoa; here fasting for labs; walnuts on oatmeal every morning  Relevant past medical, surgical, family and social history reviewed and updated as indicated. Interim medical history since our last visit reviewed. Allergies and medications reviewed and updated.  Review of Systems  Constitutional: Positive for unexpected weight change (lost appetite completely when she started back on 20 mg SSRI and then made dietary changes as well).  HENT: Positive for voice change (dry mouth, from medicines; nonsmoker).   Respiratory: Negative for cough.   Gastrointestinal:  Positive for abdominal pain (nervous stomach pain connected to anxiety, since back on medicine). Negative for blood in stool.  Genitourinary: Negative for hematuria.  Hematological: Negative for adenopathy. Does not bruise/bleed easily.  Psychiatric/Behavioral: The patient is nervous/anxious.    Per HPI unless specifically indicated above Family Hx positive for goiter in mouth    Objective:    BP 148/79 mmHg  Pulse 87  Temp(Src) 98.6 F (37 C)  Ht 5\' 6"  (1.676 m)  Wt 122 lb (55.339 kg)  BMI 19.70 kg/m2  SpO2 99%  Wt Readings from Last 3 Encounters:  08/01/14 122 lb (55.339 kg)  06/10/14 131 lb (59.421 kg)    Physical Exam  Constitutional: She appears well-developed and well-nourished. No distress.  Weight loss noted  HENT:  Head: Normocephalic and atraumatic.  Eyes: EOM are normal. No scleral icterus.  Neck: No tracheal deviation present. No thyromegaly present.  Cardiovascular: Normal rate, regular rhythm and normal heart sounds.   No murmur heard. Pulmonary/Chest: Effort normal and breath sounds normal. No respiratory distress. She has no wheezes.  Abdominal: Soft. Bowel sounds are normal. She exhibits no distension and no mass. There is no guarding.  Musculoskeletal: Normal range of motion. She exhibits no edema.  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  Neurological: She is alert. She exhibits normal muscle tone.  Skin: Skin is warm and dry. She is not diaphoretic. No pallor.  Psychiatric: She has a normal mood and affect. Her behavior is normal. Judgment and thought content normal.      Assessment & Plan:   Problem List Items Addressed This Visit      Other   Major depressive disorder, recurrent episode, mild - Primary    On 20 mg fluoxetine for years, then off, then started back; now anxious; no hx of  bipolar d/o; stop fluoxetine; start 25 mg sertraline, call before next appt if needed; return in 4-5 weeks      Relevant Medications   sertraline (ZOLOFT) 50 MG tablet   High cholesterol    She has made changes in her diet and we'll check lipids and see how numbers compare      Relevant Orders   Lipid Panel w/o Chol/HDL Ratio   Comprehensive metabolic panel   Splenic calcification    rescan in early March 2017; discovered on CT scan earlier this year; I spoke with vascular doctor       Other Visit Diagnoses    Abnormal weight loss        patient made big diet changes for cholesterol, then couldn't eat with SSRI change; close f/u; check TSH    Relevant Orders    TSH    Generalized abdominal pain        possibly anxiety-related, serotonin receptors in gut discussed; call if symptoms worsen, do not improve on med change    Relevant Orders    Comprehensive metabolic panel       Meds ordered this encounter  Medications  . Probiotic Product ([facility] PO)    Sig: Take by mouth daily.  . sertraline (ZOLOFT) 50 MG tablet    Sig: Take 0.5 tablets (25 mg total) by mouth daily. STOP fluoxetine    Dispense:  15 tablet    Refill:  0    Follow up plan: Return in about 5 weeks (around 09/05/2014) for mood, weight.

## 2014-08-01 NOTE — Patient Instructions (Addendum)
If you have not heard anything from my staff in a week about any orders/referrals/studies entered today, please contact us here to follow-up Rescan the abdomen in early March of 2017; call us in mid-February to schedule that Keep up the good job with dietary changes Stop fluoxetine Start the sertraline If you don't feel significantly better in 2-3 weeks, or if you get worse sooner, let me know right away Your goal blood pressure is less than 150 mmHg Monitor blood pressure away from here and let me know if not under better control Try to follow the DASH guidelines (DASH stands for Dietary Approaches to Stop Hypertension) Try to limit the sodium in your diet.  Ideally, consume less than 1.5 grams (less than 1,500mg ) per day. Do not add salt when cooking or at the table.  Check the sodium amount on labels when shopping, and choose items lower in sodium when given a choice. Avoid or limit foods that already contain a lot of sodium. Eat a diet rich in fruits and vegetables and whole grains. Return in 4-5 weeks for f/u

## 2014-08-02 LAB — COMPREHENSIVE METABOLIC PANEL
ALT: 10 IU/L (ref 0–32)
AST: 19 IU/L (ref 0–40)
Albumin/Globulin Ratio: 1.6 (ref 1.1–2.5)
Albumin: 4.2 g/dL (ref 3.6–4.8)
Alkaline Phosphatase: 55 IU/L (ref 39–117)
BILIRUBIN TOTAL: 0.5 mg/dL (ref 0.0–1.2)
BUN / CREAT RATIO: 18 (ref 11–26)
BUN: 12 mg/dL (ref 8–27)
CO2: 25 mmol/L (ref 18–29)
CREATININE: 0.68 mg/dL (ref 0.57–1.00)
Calcium: 9.3 mg/dL (ref 8.7–10.3)
Chloride: 99 mmol/L (ref 97–108)
GFR calc non Af Amer: 92 mL/min/{1.73_m2} (ref >59)
GFR, EST AFRICAN AMERICAN: 106 mL/min/{1.73_m2} (ref >59)
Globulin, Total: 2.6 g/dL (ref 1.5–4.5)
Glucose: 94 mg/dL (ref 65–99)
Potassium: 3.9 mmol/L (ref 3.5–5.2)
SODIUM: 139 mmol/L (ref 134–144)
TOTAL PROTEIN: 6.8 g/dL (ref 6.0–8.5)

## 2014-08-02 LAB — LIPID PANEL W/O CHOL/HDL RATIO
CHOLESTEROL TOTAL: 208 mg/dL — AB (ref 100–199)
HDL: 66 mg/dL (ref >39)
LDL Calculated: 126 mg/dL — ABNORMAL HIGH (ref 0–99)
TRIGLYCERIDES: 82 mg/dL (ref 0–149)
VLDL Cholesterol Cal: 16 mg/dL (ref 5–40)

## 2014-08-02 LAB — TSH: TSH: 0.755 u[IU]/mL (ref 0.450–4.500)

## 2014-08-04 ENCOUNTER — Encounter: Payer: Self-pay | Admitting: Family Medicine

## 2014-08-11 ENCOUNTER — Other Ambulatory Visit: Payer: Self-pay

## 2014-08-29 ENCOUNTER — Ambulatory Visit (INDEPENDENT_AMBULATORY_CARE_PROVIDER_SITE_OTHER): Payer: Medicare Other | Admitting: Family Medicine

## 2014-08-29 ENCOUNTER — Encounter: Payer: Self-pay | Admitting: Family Medicine

## 2014-08-29 VITALS — BP 129/73 | HR 98 | Temp 97.8°F | Wt 122.0 lb

## 2014-08-29 DIAGNOSIS — Z1283 Encounter for screening for malignant neoplasm of skin: Secondary | ICD-10-CM

## 2014-08-29 DIAGNOSIS — Z23 Encounter for immunization: Secondary | ICD-10-CM | POA: Diagnosis not present

## 2014-08-29 DIAGNOSIS — F330 Major depressive disorder, recurrent, mild: Secondary | ICD-10-CM

## 2014-08-29 MED ORDER — TETANUS-DIPHTH-ACELL PERTUSSIS 5-2.5-18.5 LF-MCG/0.5 IM SUSP
0.5000 mL | Freq: Once | INTRAMUSCULAR | Status: AC
  Administered 2014-08-29: 0.5 mL via INTRAMUSCULAR

## 2014-08-29 MED ORDER — SERTRALINE HCL 50 MG PO TABS
25.0000 mg | ORAL_TABLET | Freq: Every day | ORAL | Status: DC
Start: 2014-08-29 — End: 2015-08-31

## 2014-08-29 NOTE — Progress Notes (Signed)
BP 129/73 mmHg  Pulse 98  Temp(Src) 97.8 F (36.6 C)  Wt 122 lb (55.339 kg)  SpO2 99%   Subjective:    Patient ID: Shannon Weber, female    DOB: March 05, 1950, 64 y.o.   MRN: [id number]  HPI: Shannon Weber is a 64 y.o. female  Chief Complaint  Patient presents with  . Depression   Feeling better; seen today for f/u for depression and anxiety; now on 25 mg of sertraline; she thinks her previous symptoms were from starting back on meds at too high a dose; much better now Sleeping better, but still some trouble falling and staying asleep during the night; benadryl has really helped Using chamomile tea; shakiness is better Eating better weight has stabilized She drinks caffeine, but none recently; previously she would only have half a cup of caffeinated coffee in the am She has no reason to think there is anything bad like cancer going on; if she continues to lose weight   Depression screen [facility] 2/9 08/29/2014 08/01/2014  Decreased Interest 1 1  Down, Depressed, Hopeless 1 0  PHQ - 2 Score 2 1   She asked about whooping cough vaccine; she cannot remember her last tetanus booster She would like to see a dermatologist; her father had skin cancer; she does not know about any worrisome moles or skin changes  Relevant past medical, surgical, family and social history reviewed and updated as indicated. Interim medical history since our last visit reviewed. Allergies and medications reviewed and updated.  Review of Systems  Per HPI unless specifically indicated above     Objective:    BP 129/73 mmHg  Pulse 98  Temp(Src) 97.8 F (36.6 C)  Wt 122 lb (55.339 kg)  SpO2 99%  Wt Readings from Last 3 Encounters:  08/29/14 122 lb (55.339 kg)  08/01/14 122 lb (55.339 kg)  06/10/14 131 lb (59.421 kg)    Physical Exam  Constitutional: She appears well-developed and well-nourished. No distress.  Weight stable  Eyes: No scleral icterus.  Cardiovascular: Normal rate.   Pulmonary/Chest:  Effort normal.  Abdominal: She exhibits no distension.  Psychiatric: She has a normal mood and affect. Her behavior is normal. Judgment and thought content normal.    Results for orders placed or performed in visit on 08/01/14  Lipid Panel w/o Chol/HDL Ratio  Result Value Ref Range   Cholesterol, Total 208 (H) 100 - 199 mg/dL   Triglycerides 82 0 - 149 mg/dL   HDL 66 >39 mg/dL   VLDL Cholesterol Cal 16 5 - 40 mg/dL   LDL Calculated 126 (H) 0 - 99 mg/dL  Comprehensive metabolic panel  Result Value Ref Range   Glucose 94 65 - 99 mg/dL   BUN 12 8 - 27 mg/dL   Creatinine, Ser 0.68 0.57 - 1.00 mg/dL   GFR calc non Af Amer 92 >59 mL/min/1.73   GFR calc Af Amer 106 >59 mL/min/1.73   BUN/Creatinine Ratio 18 11 - 26   Sodium 139 134 - 144 mmol/L   Potassium 3.9 3.5 - 5.2 mmol/L   Chloride 99 97 - 108 mmol/L   CO2 25 18 - 29 mmol/L   Calcium 9.3 8.7 - 10.3 mg/dL   Total Protein 6.8 6.0 - 8.5 g/dL   Albumin 4.2 3.6 - 4.8 g/dL   Globulin, Total 2.6 1.5 - 4.5 g/dL   Albumin/Globulin Ratio 1.6 1.1 - 2.5   Bilirubin Total 0.5 0.0 - 1.2 mg/dL   Alkaline Phosphatase 55  39 - 117 IU/L   AST 19 0 - 40 IU/L   ALT 10 0 - 32 IU/L  TSH  Result Value Ref Range   TSH 0.755 0.450 - 4.500 uIU/mL      Assessment & Plan:   Problem List Items Addressed This Visit      Other   Major depressive disorder, recurrent episode, mild - Primary    Much improved; continue SSRI indefinitely; refills sent      Relevant Medications   sertraline (ZOLOFT) 50 MG tablet    Other Visit Diagnoses    Screening exam for skin cancer        Relevant Orders    Ambulatory referral to Dermatology    Need for prophylactic vaccination with combined diphtheria-tetanus-pertussis (DTP) vaccine        given today    Relevant Medications    Tdap (BOOSTRIX) injection 0.5 mL (Completed)       Follow up plan: No Follow-up on file.

## 2014-08-29 NOTE — Assessment & Plan Note (Signed)
Much improved; continue SSRI indefinitely; refills sent

## 2014-08-29 NOTE — Patient Instructions (Signed)
I've put in a referral for you to see the skin doctor for a yearly skin check Do try to avoid sun exposure during the peak times of day between noon and 4 pm You received the tetanus-diphtheria-pertussis booster today The tetanus-diphtheria components will last up to ten years and then you should receive a booster The pertussis (whooping cough) component will last for the rest of your life and you will not need another booster for that per the current ACIP guidelines Return in 6 months, but call sooner if any problems

## 2014-11-24 DIAGNOSIS — B360 Pityriasis versicolor: Secondary | ICD-10-CM | POA: Diagnosis not present

## 2014-11-24 DIAGNOSIS — D1800 Hemangioma unspecified site: Secondary | ICD-10-CM | POA: Diagnosis not present

## 2014-11-24 DIAGNOSIS — Z1283 Encounter for screening for malignant neoplasm of skin: Secondary | ICD-10-CM | POA: Diagnosis not present

## 2014-11-24 DIAGNOSIS — L821 Other seborrheic keratosis: Secondary | ICD-10-CM | POA: Diagnosis not present

## 2014-11-24 DIAGNOSIS — D485 Neoplasm of uncertain behavior of skin: Secondary | ICD-10-CM | POA: Diagnosis not present

## 2014-11-24 DIAGNOSIS — D229 Melanocytic nevi, unspecified: Secondary | ICD-10-CM | POA: Diagnosis not present

## 2014-11-24 DIAGNOSIS — D225 Melanocytic nevi of trunk: Secondary | ICD-10-CM | POA: Diagnosis not present

## 2014-11-24 DIAGNOSIS — L812 Freckles: Secondary | ICD-10-CM | POA: Diagnosis not present

## 2014-11-26 ENCOUNTER — Telehealth: Payer: Self-pay | Admitting: Family Medicine

## 2014-11-26 NOTE — Telephone Encounter (Signed)
Unexplained wt loss, not sure if she should be concerned.  Please call her.

## 2014-11-26 NOTE — Telephone Encounter (Signed)
Yes, please Schedule an appointment for this, thank you

## 2014-11-26 NOTE — Telephone Encounter (Signed)
Dr. Sanda Klein, should she just schedule an appointment to be seen?

## 2014-12-01 ENCOUNTER — Ambulatory Visit (INDEPENDENT_AMBULATORY_CARE_PROVIDER_SITE_OTHER): Payer: Medicare Other | Admitting: Family Medicine

## 2014-12-01 ENCOUNTER — Encounter: Payer: Self-pay | Admitting: Family Medicine

## 2014-12-01 VITALS — BP 120/77 | HR 83 | Temp 99.3°F | Ht 66.0 in | Wt 122.6 lb

## 2014-12-01 DIAGNOSIS — R1032 Left lower quadrant pain: Secondary | ICD-10-CM

## 2014-12-01 DIAGNOSIS — R1031 Right lower quadrant pain: Secondary | ICD-10-CM | POA: Diagnosis not present

## 2014-12-01 DIAGNOSIS — R634 Abnormal weight loss: Secondary | ICD-10-CM | POA: Insufficient documentation

## 2014-12-01 DIAGNOSIS — R5383 Other fatigue: Secondary | ICD-10-CM | POA: Diagnosis not present

## 2014-12-01 NOTE — Progress Notes (Signed)
BP 120/77 mmHg  Pulse 83  Temp(Src) 99.3 F (37.4 C)  Ht 5\' 6"  (1.676 m)  Wt 122 lb 9.6 oz (55.611 kg)  BMI 19.80 kg/m2  SpO2 98%   Subjective:    Patient ID: Shannon Weber, female    DOB: 03-18-1950, 64 y.o.   MRN: [id number]  HPI: Shannon Weber is a 64 y.o. female  Chief Complaint  Patient presents with  . Weight Loss    pt states she weighs at home and is lower than what it is here, states she has gone down a pant size  . Fatigue    pt states she feels tired all the time  . Nevus    pt states she had mole removed from her back a week ago and wants to have it looked at, states it has been slow to heal   She feels scrawny; wearing smaller size clothes, over last 4 months or so Appetite has been fair, not ravenous; not skipping meals She cut back on cheese and red meat, but that was a while ago, eating other foods Eats a healthy diet Maybe losing some muscle mass Others around her are noticing Clothes fit differently She had a mole removed from her back No bloating now; she has one ovary, no family hx of ovarian cancer, no abnormal paps in the past  Relevant past medical, surgical, family and social history reviewed and updated as indicated. Interim medical history since our last visit reviewed. Allergies and medications reviewed and updated.  Review of Systems  Constitutional: Positive for fatigue and unexpected weight change (patient notices weight loss, 116-118 at home on her scales, has not checked). Negative for diaphoresis and appetite change.  HENT: Negative for mouth sores and sore throat.   Respiratory: Negative for cough.   Cardiovascular: Negative for leg swelling.  Gastrointestinal: Negative for blood in stool.  Endocrine: Negative for polydipsia and polyuria.  Genitourinary: Negative for hematuria.  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 Negative for tremors, seizures, syncope, facial asymmetry, speech difficulty and weakness.       Has not noticed dizziness; "I'm clutzy", not new  Hematological: Negative for adenopathy. Does not bruise/bleed easily.  Psychiatric/Behavioral: Negative for confusion and agitation. The patient is not hyperactive.    Per HPI unless specifically indicated above     Objective:    BP 120/77 mmHg  Pulse 83  Temp(Src) 99.3 F (37.4 C)  Ht 5\' 6"  (1.676 m)  Wt 122 lb 9.6 oz (55.611 kg)  BMI 19.80 kg/m2  SpO2 98%  Wt Readings from Last 3 Encounters:  12/01/14 122 lb 9.6 oz (55.611 kg)  08/29/14 122 lb (55.339 kg)  08/01/14 122 lb (55.339 kg)    Physical Exam  Constitutional: She appears well-developed and well-nourished. No distress.  Weight stable, healthy (normal) BMI  HENT:  Head: Normocephalic and atraumatic.  Eyes: EOM are normal. No scleral icterus.  Neck: Trachea normal. Neck supple. No JVD present. No thyroid mass and no thyromegaly present.  Cardiovascular: Normal rate and regular rhythm.   No extrasystoles are present.  Pulmonary/Chest: Effort normal and breath sounds normal.  Mild thoracic kyphoscoliosis  Abdominal: Soft. Normal appearance and bowel sounds are normal. She exhibits no distension. There is no hepatosplenomegaly. There  is tenderness (very mild) in the periumbilical area and suprapubic area. No hernia.  Genitourinary:  Deferred to next week (patient's visit extended past 5 pm)  Lymphadenopathy:    She has no cervical adenopathy.  Neurological: She is alert. She displays no tremor.  Reflex Scores:      Patellar reflexes are 2+ on the right side and 2+ on the left side. Skin: Skin is warm and dry. No rash noted. She is not diaphoretic. No pallor.  Psychiatric: She has a normal mood and affect. Her behavior is normal. Judgment and thought content normal.       Assessment & Plan:   Problem List Items Addressed This Visit      Other   Fatigue - Primary    Check multiple labs; will have her do calorie count to make sure she is taking in enough calories; supplement/multiple vitamin; light handweights for muscle definition; further work-up as indicated      Relevant Orders   CBC with Differential/Platelet (Completed)   Weight loss, unintentional    Scales here show stable weight and normal BMI; however, I trust patient's assessment of how her clothes fit; will check labs to include thyroid; have her return next week for pelvic exam, urinalysis, and likely pelvic US versus abd/pelv CT scan; I apologized for running so late today and she is fine with returning next week for continuation of our work-up      Relevant Orders   T4, free (Completed)   TSH (Completed)   Bilateral lower abdominal discomfort    Return next week for pelvic exam, urinalysis, and likely pelvic US versus abd/pelv CT scan; I apologized for running so late today and she is fine with returning next week for continuation of our work-up         Follow up plan: No Follow-up on file. (next week)  Orders Placed This Encounter  Procedures  . T4, free  . TSH  . CBC with Differential/Platelet

## 2014-12-01 NOTE — Patient Instructions (Addendum)
Please do a calorie count through myfitnesspal or another free calorie counter Return in 1 week for pelvic exam and urine Resistance training or light hand weights

## 2014-12-02 ENCOUNTER — Encounter: Payer: Self-pay | Admitting: Family Medicine

## 2014-12-02 LAB — CBC WITH DIFFERENTIAL/PLATELET
Basophils Absolute: 0 10*3/uL (ref 0.0–0.2)
Basos: 1 %
EOS (ABSOLUTE): 0.1 10*3/uL (ref 0.0–0.4)
EOS: 1 %
HEMATOCRIT: 38.4 % (ref 34.0–46.6)
Hemoglobin: 12.5 g/dL (ref 11.1–15.9)
Immature Grans (Abs): 0 10*3/uL (ref 0.0–0.1)
Immature Granulocytes: 0 %
LYMPHS ABS: 1.5 10*3/uL (ref 0.7–3.1)
Lymphs: 33 %
MCH: 29.6 pg (ref 26.6–33.0)
MCHC: 32.6 g/dL (ref 31.5–35.7)
MCV: 91 fL (ref 79–97)
MONOS ABS: 0.4 10*3/uL (ref 0.1–0.9)
Monocytes: 10 %
Neutrophils Absolute: 2.5 10*3/uL (ref 1.4–7.0)
Neutrophils: 55 %
Platelets: 236 10*3/uL (ref 150–379)
RBC: 4.22 x10E6/uL (ref 3.77–5.28)
RDW: 13.3 % (ref 12.3–15.4)
WBC: 4.6 10*3/uL (ref 3.4–10.8)

## 2014-12-02 LAB — TSH: TSH: 0.615 u[IU]/mL (ref 0.450–4.500)

## 2014-12-02 LAB — T4, FREE: FREE T4: 1.23 ng/dL (ref 0.82–1.77)

## 2014-12-06 DIAGNOSIS — R1032 Left lower quadrant pain: Secondary | ICD-10-CM

## 2014-12-06 DIAGNOSIS — R1031 Right lower quadrant pain: Secondary | ICD-10-CM | POA: Insufficient documentation

## 2014-12-06 NOTE — Assessment & Plan Note (Signed)
Check multiple labs; will have her do calorie count to make sure she is taking in enough calories; supplement/multiple vitamin; light handweights for muscle definition; further work-up as indicated

## 2014-12-06 NOTE — Assessment & Plan Note (Signed)
Scales here show stable weight and normal BMI; however, I trust patient's assessment of how her clothes fit; will check labs to include thyroid; have her return next week for pelvic exam, urinalysis, and likely pelvic US versus abd/pelv CT scan; I apologized for running so late today and she is fine with returning next week for continuation of our work-up

## 2014-12-06 NOTE — Assessment & Plan Note (Signed)
Return next week for pelvic exam, urinalysis, and likely pelvic US versus abd/pelv CT scan; I apologized for running so late today and she is fine with returning next week for continuation of our work-up

## 2014-12-11 ENCOUNTER — Encounter: Payer: Self-pay | Admitting: Family Medicine

## 2014-12-11 ENCOUNTER — Ambulatory Visit (INDEPENDENT_AMBULATORY_CARE_PROVIDER_SITE_OTHER): Payer: Medicare Other | Admitting: Family Medicine

## 2014-12-11 VITALS — BP 110/69 | HR 93 | Temp 98.7°F | Ht 64.75 in | Wt 121.8 lb

## 2014-12-11 DIAGNOSIS — R3129 Other microscopic hematuria: Secondary | ICD-10-CM

## 2014-12-11 DIAGNOSIS — R5383 Other fatigue: Secondary | ICD-10-CM

## 2014-12-11 DIAGNOSIS — R8290 Unspecified abnormal findings in urine: Secondary | ICD-10-CM | POA: Diagnosis not present

## 2014-12-11 DIAGNOSIS — R634 Abnormal weight loss: Secondary | ICD-10-CM

## 2014-12-11 LAB — MICROSCOPIC EXAMINATION

## 2014-12-11 NOTE — Patient Instructions (Addendum)
[email or web address] is the website I told you about We'll refer you to Dr. Yves Dill to evaluate the blood in your urine Our lab will be doing a urine culture, so we'll contact you if any antibiotics are needed Do monitor your weight and let me know if you lose any more Let me know too of any new symptoms or problems

## 2014-12-11 NOTE — Assessment & Plan Note (Signed)
Improved since stopping Benadryl

## 2014-12-11 NOTE — Progress Notes (Signed)
BP 110/69 mmHg  Pulse 93  Temp(Src) 98.7 F (37.1 C)  Ht 5' 4.75" (1.645 m)  Wt 121 lb 12.8 oz (55.248 kg)  BMI 20.42 kg/m2  SpO2 99%   Subjective:    Patient ID: Shannon Weber, female    DOB: [birth year], 64 y.o.   MRN: [id number]  HPI: Shannon Weber is a 64 y.o. female  Chief Complaint  Patient presents with  . Follow-up    From 12/01/2014 for weight loss fatigue  . Gynecologic Exam   Blood in the urine; completely not visible to her eye; 3+ on the dip; hx of kidney stones in the past but it's been a [place] time, Dr. Yves Dill; saw him a [place] time; thanksgiving day years ago; she had other stones but doesn't recall passing them; no urinary odor  She was having a lot of trouble sleeping; started taking benadryl at night; after we talked last visit, she considered stopping the benadryl; took it before bed, but she stopped it; her tiredness is some better  She lost almost one pound since last visit; up and going in the morning  I asked about pain, and she does get back pain sometimes; thought it was joints and it's lower mid back, changes with position; she does not feel full early; no nausea, had some ulcer type symptoms [place] ago with H pylori; nothing recently; taking aloe vera and probiotic  No vaginal discharge; bowels are moving   No breast lumps  Relevant past medical, surgical, family and social history reviewed and updated as indicated. Interim medical history since our last visit reviewed. Allergies and medications reviewed and updated.  Review of Systems  Per HPI unless specifically indicated above     Objective:    BP 110/69 mmHg  Pulse 93  Temp(Src) 98.7 F (37.1 C)  Ht 5' 4.75" (1.645 m)  Wt 121 lb 12.8 oz (55.248 kg)  BMI 20.42 kg/m2  SpO2 99%  Wt Readings from Last 3 Encounters:  12/11/14 121 lb 12.8 oz (55.248 kg)  12/01/14 122 lb 9.6 oz (55.611 kg)  08/29/14 122 lb (55.339 kg)    Physical Exam  Constitutional: She appears well-developed and  well-nourished. No distress.  Weight stable, healthy (normal) BMI  HENT:  Head: Normocephalic and atraumatic.  Eyes: EOM are normal. No scleral icterus.  Neck: Trachea normal. Neck supple. No JVD present. No thyroid mass and no thyromegaly present.  Cardiovascular: Normal rate and regular rhythm.   No extrasystoles are present.  Pulmonary/Chest: Effort normal and breath sounds normal.  Mild thoracic kyphoscoliosis  Abdominal: Soft. Normal appearance and bowel sounds are normal. She exhibits no distension. There is no hepatosplenomegaly. There is no tenderness (very mild). No hernia. Hernia confirmed negative in the right inguinal area and confirmed negative in the left inguinal area.  Genitourinary: Rectal exam shows no external hemorrhoid. There is no rash or lesion on the right labia. There is no rash or lesion on the left labia. No erythema or tenderness in the vagina. No vaginal discharge found.  Lymphadenopathy:    She has no cervical adenopathy.       Right: No inguinal adenopathy present.       Left: No inguinal adenopathy present.  Neurological: She is alert. She displays no tremor.  Reflex Scores:      Patellar reflexes are 2+ on the right side and 2+ on the left side. Skin: Skin is warm and dry. No rash noted. She is not diaphoretic. No pallor.  Psychiatric: She has a normal mood and affect. Her behavior is normal. Judgment and thought content normal.   Results for orders placed or performed in visit on 12/11/14  Microscopic Examination  Result Value Ref Range   WBC, UA 0-5 0 -  5 /hpf   RBC, UA 3-10 (A) 0 -  2 /hpf   Epithelial Cells (non renal) 0-10 0 - 10 /hpf  UA/M w/rflx Culture, Routine  Result Value Ref Range   Urine Culture, Routine Final report    Urine Culture result 1 Comment       Assessment & Plan:   Problem List Items Addressed This Visit      Genitourinary   Hematuria, microscopic    Urine reviewed; will get abd and pelvic CT scan and refer her back to  see Dr. Yves Dill (her urologist)      Relevant Orders   Ambulatory referral to Urology   CT Abdomen Pelvis W Contrast     Other   Fatigue    Improved since stopping Benadryl      Weight loss, unintentional    Will get abdominal and pelvic CT scan and refer to urologist; reviewed recent labs including normal thyroid tests, normal CBC, no anemia       Other Visit Diagnoses    Weight loss    -  Primary    Relevant Orders    UA/M w/rflx Culture, Routine (Completed)    Ambulatory referral to Urology    CT Abdomen Pelvis W Contrast       Follow up plan: Return if symptoms worsen or fail to improve. She will call me with update rather than fixed appt date/time An after-visit summary was printed and given to the patient at [facility].  Please see the patient instructions which may contain other information and recommendations beyond what is mentioned above in the assessment and plan. Meds ordered this encounter  Medications  . Multiple Vitamins-Minerals (MULTIVITAMIN & MINERAL PO)    Sig: Take 500 mg by mouth daily.   Orders Placed This Encounter  Procedures  . Microscopic Examination  . CT Abdomen Pelvis W Contrast  . UA/M w/rflx Culture, Routine  . Ambulatory referral to Urology

## 2014-12-13 LAB — UA/M W/RFLX CULTURE, ROUTINE

## 2014-12-16 NOTE — Assessment & Plan Note (Signed)
Urine reviewed; will get abd and pelvic CT scan and refer her back to see Dr. Yves Dill (her urologist)

## 2014-12-16 NOTE — Assessment & Plan Note (Signed)
Will get abdominal and pelvic CT scan and refer to urologist; reviewed recent labs including normal thyroid tests, normal CBC, no anemia

## 2014-12-18 DIAGNOSIS — N200 Calculus of kidney: Secondary | ICD-10-CM | POA: Diagnosis not present

## 2014-12-18 DIAGNOSIS — R3121 Asymptomatic microscopic hematuria: Secondary | ICD-10-CM | POA: Diagnosis not present

## 2014-12-18 DIAGNOSIS — R3129 Other microscopic hematuria: Secondary | ICD-10-CM | POA: Diagnosis not present

## 2014-12-19 ENCOUNTER — Other Ambulatory Visit: Payer: Self-pay | Admitting: Urology

## 2014-12-19 DIAGNOSIS — R3129 Other microscopic hematuria: Secondary | ICD-10-CM

## 2014-12-19 DIAGNOSIS — N200 Calculus of kidney: Secondary | ICD-10-CM

## 2014-12-23 ENCOUNTER — Ambulatory Visit
Admission: RE | Admit: 2014-12-23 | Discharge: 2014-12-23 | Disposition: A | Discharge status: Home / Self Care | Payer: Medicare Other | Source: Ambulatory Visit | Attending: Urology | Admitting: Urology

## 2014-12-23 DIAGNOSIS — N200 Calculus of kidney: Secondary | ICD-10-CM | POA: Diagnosis not present

## 2014-12-23 DIAGNOSIS — I700 Atherosclerosis of aorta: Secondary | ICD-10-CM | POA: Insufficient documentation

## 2014-12-23 DIAGNOSIS — R3129 Other microscopic hematuria: Secondary | ICD-10-CM | POA: Insufficient documentation

## 2014-12-23 MED ORDER — IOHEXOL 300 MG/ML  SOLN
150.0000 mL | Freq: Once | INTRAMUSCULAR | Status: AC | PRN
  Administered 2014-12-23: 120 mL via INTRAVENOUS

## 2014-12-24 DIAGNOSIS — R3121 Asymptomatic microscopic hematuria: Secondary | ICD-10-CM | POA: Diagnosis not present

## 2014-12-24 DIAGNOSIS — N200 Calculus of kidney: Secondary | ICD-10-CM | POA: Diagnosis not present

## 2014-12-25 ENCOUNTER — Encounter: Payer: Self-pay | Admitting: *Deleted

## 2014-12-25 ENCOUNTER — Ambulatory Visit
Admission: RE | Admit: 2014-12-25 | Discharge: 2014-12-25 | Disposition: A | Discharge status: Home / Self Care | Payer: Medicare Other | Source: Ambulatory Visit | Attending: Urology | Admitting: Urology

## 2014-12-25 ENCOUNTER — Encounter
Admission: RE | Disposition: A | Discharge status: Home / Self Care | Payer: Self-pay | Source: Ambulatory Visit | Attending: Urology

## 2014-12-25 ENCOUNTER — Ambulatory Visit: Payer: No Typology Code available for payment source

## 2014-12-25 DIAGNOSIS — Z7982 Long term (current) use of aspirin: Secondary | ICD-10-CM | POA: Diagnosis not present

## 2014-12-25 DIAGNOSIS — N200 Calculus of kidney: Secondary | ICD-10-CM | POA: Insufficient documentation

## 2014-12-25 HISTORY — PX: EXTRACORPOREAL SHOCK WAVE LITHOTRIPSY: SHX1557

## 2014-12-25 SURGERY — LITHOTRIPSY, ESWL
Anesthesia: Moderate Sedation | Laterality: Left

## 2014-12-25 MED ORDER — FUROSEMIDE 10 MG/ML IJ SOLN
INTRAMUSCULAR | Status: DC
Start: 2014-12-25 — End: 2014-12-25
  Filled 2014-12-25: qty 2

## 2014-12-25 MED ORDER — LEVOFLOXACIN 500 MG PO TABS: 500.0000 mg | ORAL_TABLET | Freq: Every day | ORAL | Status: DC

## 2014-12-25 MED ORDER — TAMSULOSIN HCL 0.4 MG PO CAPS: 0.4000 mg | ORAL_CAPSULE | Freq: Every day | ORAL | Status: DC

## 2014-12-25 MED ORDER — LEVOFLOXACIN 500 MG PO TABS
ORAL_TABLET | ORAL | Status: AC
  Administered 2014-12-25: 500 mg via ORAL
  Filled 2014-12-25: qty 1

## 2014-12-25 MED ORDER — MIDAZOLAM HCL 2 MG/2ML IJ SOLN
1.0000 mg | Freq: Once | INTRAMUSCULAR | Status: AC
  Administered 2014-12-25: 1 mg via INTRAMUSCULAR

## 2014-12-25 MED ORDER — PROMETHAZINE HCL 25 MG/ML IJ SOLN
25.0000 mg | Freq: Once | INTRAMUSCULAR | Status: AC
  Administered 2014-12-25: 25 mg via INTRAMUSCULAR

## 2014-12-25 MED ORDER — DIPHENHYDRAMINE HCL 25 MG PO CAPS
25.0000 mg | ORAL_CAPSULE | ORAL | Status: AC
  Administered 2014-12-25: 25 mg via ORAL

## 2014-12-25 MED ORDER — LEVOFLOXACIN 500 MG PO TABS
500.0000 mg | ORAL_TABLET | Freq: Once | ORAL | Status: AC
  Administered 2014-12-25: 500 mg via ORAL

## 2014-12-25 MED ORDER — MIDAZOLAM HCL 2 MG/2ML IJ SOLN
INTRAMUSCULAR | Status: AC
Start: 2014-12-25 — End: 2014-12-25
  Administered 2014-12-25: 1 mg via INTRAMUSCULAR
  Filled 2014-12-25: qty 2

## 2014-12-25 MED ORDER — MORPHINE SULFATE (PF) 10 MG/ML IV SOLN
10.0000 mg | Freq: Once | INTRAVENOUS | Status: AC
  Administered 2014-12-25: 10 mg via INTRAMUSCULAR

## 2014-12-25 MED ORDER — FUROSEMIDE 10 MG/ML IJ SOLN
10.0000 mg | Freq: Once | INTRAMUSCULAR | Status: AC
  Administered 2014-12-25: 10 mg via INTRAVENOUS

## 2014-12-25 MED ORDER — DEXTROSE-NACL 5-0.45 % IV SOLN
INTRAVENOUS | Status: DC
Start: 2014-12-25 — End: 2014-12-25
  Administered 2014-12-25: 13:00:00 via INTRAVENOUS

## 2014-12-25 MED ORDER — MORPHINE SULFATE (PF) 10 MG/ML IV SOLN
INTRAVENOUS | Status: AC
  Administered 2014-12-25: 10 mg via INTRAMUSCULAR
  Filled 2014-12-25: qty 1

## 2014-12-25 MED ORDER — PROMETHAZINE HCL 25 MG/ML IJ SOLN
INTRAMUSCULAR | Status: AC
  Administered 2014-12-25: 25 mg via INTRAMUSCULAR
  Filled 2014-12-25: qty 1

## 2014-12-25 MED ORDER — DIPHENHYDRAMINE HCL 25 MG PO CAPS
ORAL_CAPSULE | ORAL | Status: AC
  Administered 2014-12-25: 25 mg via ORAL
  Filled 2014-12-25: qty 1

## 2014-12-25 MED ORDER — ONDANSETRON 8 MG PO TBDP: 8.0000 mg | ORAL_TABLET | Freq: Four times a day (QID) | ORAL | Status: DC | PRN

## 2014-12-25 MED ORDER — NUCYNTA 50 MG PO TABS: 50.0000 mg | ORAL_TABLET | Freq: Four times a day (QID) | ORAL | Status: DC | PRN

## 2014-12-25 MED ORDER — DOCUSATE SODIUM 100 MG PO CAPS: 200.0000 mg | ORAL_CAPSULE | Freq: Two times a day (BID) | ORAL | Status: DC

## 2014-12-25 NOTE — Discharge Instructions (Addendum)
Dietary Guidelines to Help Prevent Kidney Stones °Your risk of kidney stones can be decreased by adjusting the foods you eat. The most important thing you can do is drink enough fluid. You should drink enough fluid to keep your urine clear or pale yellow. The following guidelines provide specific information for the type of kidney stone you have had. °GUIDELINES ACCORDING TO TYPE OF KIDNEY STONE °Calcium Oxalate Kidney Stones °· Reduce the amount of salt you eat. Foods that have a lot of salt cause your body to release excess calcium into your urine. The excess calcium can combine with a substance called oxalate to form kidney stones. °· Reduce the amount of animal protein you eat if the amount you eat is excessive. Animal protein causes your body to release excess calcium into your urine. Ask your dietitian how much protein from animal sources you should be eating. °· Avoid foods that are high in oxalates. If you take vitamins, they should have less than 500 mg of vitamin C. Your body turns vitamin C into oxalates. You do not need to avoid fruits and vegetables high in vitamin C. °Calcium Phosphate Kidney Stones °· Reduce the amount of salt you eat to help prevent the release of excess calcium into your urine. °· Reduce the amount of animal protein you eat if the amount you eat is excessive. Animal protein causes your body to release excess calcium into your urine. Ask your dietitian how much protein from animal sources you should be eating. °· Get enough calcium from food or take a calcium supplement (ask your dietitian for recommendations). Food sources of calcium that do not increase your risk of kidney stones include: °· Broccoli. °· Dairy products, such as cheese and yogurt. °· Pudding. °Uric Acid Kidney Stones °· Do not have more than 6 oz of animal protein per day. °FOOD SOURCES °Animal Protein Sources °· Meat (all types). °· Poultry. °· Eggs. °· Fish, seafood. °Foods High in Salt °· Salt seasonings. °· Soy  sauce. °· Teriyaki sauce. °· Cured and processed meats. °· Salted crackers and snack foods. °· Fast food. °· Canned soups and most canned foods. °Foods High in Oxalates °· Grains: °· Amaranth. °· Barley. °· Grits. °· Wheat germ. °· Bran. °· Buckwheat flour. °· All bran cereals. °· Pretzels. °· Whole wheat bread. °· Vegetables: °· Beans (wax). °· Beets and beet greens. °· Collard greens. °· Eggplant. °· Escarole. °· Leeks. °· Okra. °· Parsley. °· Rutabagas. °· Spinach. °· Swiss chard. °· Tomato paste. °· Fried potatoes. °· Sweet potatoes. °· Fruits: °· Red currants. °· Figs. °· Kiwi. °· Rhubarb. °· Meat and Other Protein Sources: °· Beans (dried). °· Soy burgers and other soybean products. °· Miso. °· Nuts (peanuts, almonds, pecans, cashews, hazelnuts). °· Nut butters. °· Sesame seeds and tahini (paste made of sesame seeds). °· Poppy seeds. °· Beverages: °· Chocolate drink mixes. °· Soy milk. °· Instant iced tea. °· Juices made from high-oxalate fruits or vegetables. °· Other: °· Carob. °· Chocolate. °· Fruitcake. °· Marmalades. °  °This information is not intended to replace advice given to you by your health care provider. Make sure you discuss any questions you have with your health care provider. °  °Document Released: 05/28/2010 Document Revised: 02/05/2013 Document Reviewed: 12/28/2012 °Elsevier Interactive Patient Education ©2016 [employer] ° °Kidney Stones °Kidney stones (urolithiasis) are deposits that form inside your kidneys. The intense pain is caused by the stone moving through the urinary tract. When the stone moves, the ureter   goes into spasm around the stone. The stone is usually passed in the urine.  °CAUSES  °· A disorder that makes certain neck glands produce too much parathyroid hormone (primary hyperparathyroidism). °· A buildup of uric acid crystals, similar to gout in your joints. °· Narrowing (stricture) of the ureter. °· A kidney obstruction present at birth (congenital  obstruction). °· Previous surgery on the kidney or ureters. °· Numerous kidney infections. °SYMPTOMS  °· Feeling sick to your stomach (nauseous). °· Throwing up (vomiting). °· Blood in the urine (hematuria). °· Pain that usually spreads (radiates) to the groin. °· Frequency or urgency of urination. °DIAGNOSIS  °· Taking a history and physical exam. °· Blood or urine tests. °· CT scan. °· Occasionally, an examination of the inside of the urinary bladder (cystoscopy) is performed. °TREATMENT  °· Observation. °· Increasing your fluid intake. °· Extracorporeal shock wave lithotripsy--This is a noninvasive procedure that uses shock waves to break up kidney stones. °· Surgery may be needed if you have severe pain or persistent obstruction. There are various surgical procedures. Most of the procedures are performed with the use of small instruments. Only small incisions are needed to accommodate these instruments, so recovery time is minimized. °The size, location, and chemical composition are all important variables that will determine the proper choice of action for you. Talk to your health care provider to better understand your situation so that you will minimize the risk of injury to yourself and your kidney.  °HOME CARE INSTRUCTIONS  °· Drink enough water and fluids to keep your urine clear or pale yellow. This will help you to pass the stone or stone fragments. °· Strain all urine through the provided strainer. Keep all particulate matter and stones for your health care provider to see. The stone causing the pain may be as small as a grain of salt. It is very important to use the strainer each and every time you pass your urine. The collection of your stone will allow your health care provider to analyze it and verify that a stone has actually passed. The stone analysis will often identify what you can do to reduce the incidence of recurrences. °· Only take over-the-counter or prescription medicines for pain,  discomfort, or fever as directed by your health care provider. °· Keep all follow-up visits as told by your health care provider. This is important. °· Get follow-up X-rays if required. The absence of pain does not always mean that the stone has passed. It may have only stopped moving. If the urine remains completely obstructed, it can cause loss of kidney function or even complete destruction of the kidney. It is your responsibility to make sure X-rays and follow-ups are completed. Ultrasounds of the kidney can show blockages and the status of the kidney. Ultrasounds are not associated with any radiation and can be performed easily in a matter of minutes. °· Make changes to your daily diet as told by your health care provider. You may be told to: °· Limit the amount of salt that you eat. °· Eat 5 or more servings of fruits and vegetables each day. °· Limit the amount of meat, poultry, fish, and eggs that you eat. °· Collect a 24-hour urine sample as told by your health care provider. You may need to collect another urine sample every 6-12 months. °SEEK MEDICAL CARE IF: °· You experience pain that is progressive and unresponsive to any pain medicine you have been prescribed. °SEEK IMMEDIATE MEDICAL CARE IF:  °· Pain   cannot be controlled with the prescribed medicine.  You have a fever or shaking chills.  The severity or intensity of pain increases over 18 hours and is not relieved by pain medicine.  You develop a new onset of abdominal pain.  You feel faint or pass out.  You are unable to urinate.   This information is not intended to replace advice given to you by your health care provider. Make sure you discuss any questions you have with your health care provider.   Document Released: 01/31/2005 Document Revised: 10/22/2014 Document Reviewed: 07/04/2012 Elsevier Interactive Patient Education 2016 [place].  Kidney Stones Kidney stones (urolithiasis) are deposits that form inside your  kidneys. The intense pain is caused by the stone moving through the urinary tract. When the stone moves, the ureter goes into spasm around the stone. The stone is usually passed in the urine.  CAUSES   A disorder that makes certain neck glands produce too much parathyroid hormone (primary hyperparathyroidism).  A buildup of uric acid crystals, similar to gout in your joints.  Narrowing (stricture) of the ureter.  A kidney obstruction present at birth (congenital obstruction).  Previous surgery on the kidney or ureters.  Numerous kidney infections. SYMPTOMS   Feeling sick to your stomach (nauseous).  Throwing up (vomiting).  Blood in the urine (hematuria).  Pain that usually spreads (radiates) to the groin.  Frequency or urgency of urination. DIAGNOSIS   Taking a history and physical exam.  Blood or urine tests.  CT scan.  Occasionally, an examination of the inside of the urinary bladder (cystoscopy) is performed. TREATMENT   Observation.  Increasing your fluid intake.  Extracorporeal shock wave lithotripsy--This is a noninvasive procedure that uses shock waves to break up kidney stones.  Surgery may be needed if you have severe pain or persistent obstruction. There are various surgical procedures. Most of the procedures are performed with the use of small instruments. Only small incisions are needed to accommodate these instruments, so recovery time is minimized. The size, location, and chemical composition are all important variables that will determine the proper choice of action for you. Talk to your health care provider to better understand your situation so that you will minimize the risk of injury to yourself and your kidney.  HOME CARE INSTRUCTIONS   Drink enough water and fluids to keep your urine clear or pale yellow. This will help you to pass the stone or stone fragments.  Strain all urine through the provided strainer. Keep all particulate matter and stones  for your health care provider to see. The stone causing the pain may be as small as a grain of salt. It is very important to use the strainer each and every time you pass your urine. The collection of your stone will allow your health care provider to analyze it and verify that a stone has actually passed. The stone analysis will often identify what you can do to reduce the incidence of recurrences.  Only take over-the-counter or prescription medicines for pain, discomfort, or fever as directed by your health care provider.  Keep all follow-up visits as told by your health care provider. This is important.  Get follow-up X-rays if required. The absence of pain does not always mean that the stone has passed. It may have only stopped moving. If the urine remains completely obstructed, it can cause loss of kidney function or even complete destruction of the kidney. It is your responsibility to make sure X-rays and follow-ups are  completed. Ultrasounds of the kidney can show blockages and the status of the kidney. Ultrasounds are not associated with any radiation and can be performed easily in a matter of minutes.  Make changes to your daily diet as told by your health care provider. You may be told to:  Limit the amount of salt that you eat.  Eat 5 or more servings of fruits and vegetables each day.  Limit the amount of meat, poultry, fish, and eggs that you eat.  Collect a 24-hour urine sample as told by your health care provider.You may need to collect another urine sample every 6-12 months. SEEK MEDICAL CARE IF:  You experience pain that is progressive and unresponsive to any pain medicine you have been prescribed. SEEK IMMEDIATE MEDICAL CARE IF:   Pain cannot be controlled with the prescribed medicine.  You have a fever or shaking chills.  The severity or intensity of pain increases over 18 hours and is not relieved by pain medicine.  You develop a new onset of abdominal pain.  You  feel faint or pass out.  You are unable to urinate.   This information is not intended to replace advice given to you by your health care provider. Make sure you discuss any questions you have with your health care provider.   Document Released: 01/31/2005 Document Revised: 10/22/2014 Document Reviewed: 07/04/2012 Elsevier Interactive Patient Education 2016 [place], Care After Refer to this sheet in the next few weeks. These instructions provide you with information on caring for yourself after your procedure. Your health care provider may also give you more specific instructions. Your treatment has been planned according to current medical practices, but problems sometimes occur. Call your health care provider if you have any problems or questions after your procedure. WHAT TO EXPECT AFTER THE PROCEDURE   Your urine may have a red tinge for a few days after treatment. Blood loss is usually minimal.  You may have soreness in the back or flank area. This usually goes away after a few days. The procedure can cause blotches or bruises on the back where the pressure wave enters the skin. These marks usually cause only minimal discomfort and should disappear in a short time.  Stone fragments should begin to pass within 24 hours of treatment. However, a delayed passage is not unusual.  You may have pain, discomfort, and feel sick to your stomach (nauseated) when the crushed fragments of stone are passed down the tube from the kidney to the bladder. Stone fragments can pass soon after the procedure and may last for up to 4-8 weeks.  A small number of patients may have severe pain when stone fragments are not able to pass, which leads to an obstruction.  If your stone is greater than 1 inch (2.5 cm) in diameter or if you have multiple stones that have a combined diameter greater than 1 inch (2.5 cm), you may require more than one treatment.  If you had a stent placed prior to  your procedure, you may experience some discomfort, especially during urination. You may experience the pain or discomfort in your flank or back, or you may experience a sharp pain or discomfort at the base of your penis or in your lower abdomen. The discomfort usually lasts only a few minutes after urinating. HOME CARE INSTRUCTIONS   Rest at home until you feel your energy improving.  Only take over-the-counter or prescription medicines for pain, discomfort, or fever as directed by your  health care provider. Depending on the type of lithotripsy, you may need to take antibiotics and anti-inflammatory medicines for a few days.  Drink enough water and fluids to keep your urine clear or pale yellow. This helps "flush" your kidneys. It helps pass any remaining pieces of stone and prevents stones from coming back.  Most people can resume daily activities within 1-2 days after standard lithotripsy. It can take longer to recover from laser and percutaneous lithotripsy.  Strain all urine through the provided strainer. Keep all particulate matter and stones for your health care provider to see. The stone may be as small as a grain of salt. It is very important to use the strainer each and every time you pass your urine. Any stones that are found can be sent to a medical lab for examination.  Visit your health care provider for a follow-up appointment in a few weeks. Your doctor may remove your stent if you have one. Your health care provider will also check to see whether stone particles still remain. SEEK MEDICAL CARE IF:   Your pain is not relieved by medicine.  You have a lasting nauseous feeling.  You feel there is too much blood in the urine.  You develop persistent problems with frequent or painful urination that does not at least partially improve after 2 days following the procedure.  You have a congested cough.  You feel lightheaded.  You develop a rash or any other signs that might  suggest an allergic problem.  You develop any reaction or side effects to your medicine(s). SEEK IMMEDIATE MEDICAL CARE IF:   You experience severe back or flank pain or both.  You see nothing but blood when you urinate.  You cannot pass any urine at all.  You have a fever or shaking chills.  You develop shortness of breath, difficulty breathing, or chest pain.  You develop vomiting that will not stop after 6-8 hours.  You have a fainting episode.   This information is not intended to replace advice given to you by your health care provider. Make sure you discuss any questions you have with your health care provider.   Document Released: 02/20/2007 Document Revised: 10/22/2014 Document Reviewed: 08/16/2012 Elsevier Interactive Patient Education [employer].  Lithotripsy Lithotripsy is a treatment that can sometimes help eliminate kidney stones and pain that they cause. A form of lithotripsy, also known as extracorporeal shock wave lithotripsy, is a nonsurgical procedure that helps your body rid itself of the kidney stone when it is too big to pass on its own. Extracorporeal shock wave lithotripsy is a method of crushing a kidney stone with shock waves. These shock waves pass through your body and are focused on your stone. They cause the kidney stones to crumble while still in the urinary tract. It is then easier for the smaller pieces of stone to pass in the urine. Lithotripsy usually takes about an hour. It is done in a hospital, a lithotripsy center, or a mobile unit. It usually does not require an overnight stay. Your health care provider will instruct you on preparation for the procedure. Your health care provider will tell you what to expect afterward. LET [facility] CARE PROVIDER KNOW ABOUT:  Any allergies you have.  All medicines you are taking, including vitamins, herbs, eye drops, creams, and over-the-counter medicines.  Previous problems you or members of your  family have had with the use of anesthetics.  Any blood disorders you have.  Previous surgeries  you have had.  Medical conditions you have. RISKS AND COMPLICATIONS Generally, lithotripsy for kidney stones is a safe procedure. However, as with any procedure, complications can occur. Possible complications include:  Infection.  Bleeding of the kidney.  Bruising of the kidney or skin.  Obstruction of the ureter.  Failure of the stone to fragment. BEFORE THE PROCEDURE  Do not eat or drink for 6-8 hours prior to the procedure. You may, however, take the medications with a sip of water that your physician instructs you to take  Do not take aspirin or aspirin-containing products for 7 days prior to your procedure  Do not take nonsteroidal anti-inflammatory products for 7 days prior to your procedure PROCEDURE A stent (flexible tube with holes) may be placed in your ureter. The ureter is the tube that transports the urine from the kidneys to the bladder. Your health care provider may place a stent before the procedure. This will help keep urine flowing from the kidney if the fragments of the stone block the ureter. You may have an IV tube placed in one of your veins to give you fluids and medicines. These medicines may help you relax or make you sleep. During the procedure, you will lie comfortably on a fluid-filled cushion or in a warm-water bath. After an X-ray or ultrasound exam to locate your stone, shock waves are aimed at the stone. If you are awake, you may feel a tapping sensation as the shock waves pass through your body. If large stone particles remain after treatment, a second procedure may be necessary at a later date. For comfort during the test:  Relax as much as possible.  Try to remain still as much as possible.  Try to follow instructions to speed up the test.  Let your health care provider know if you are uncomfortable, anxious, or in pain. AFTER THE PROCEDURE  After  surgery, you will be taken to the recovery area. A nurse will watch and check your progress. Once you're awake, stable, and taking fluids well, you will be allowed to go home as [place] as there are no problems. You will also be allowed to pass your urine before discharge.You may be given antibiotics to help prevent infection. You may also be prescribed pain medicine if needed. In a week or two, your health care provider may remove your stent, if you have one. You may first have an X-ray exam to check on how successful the fragmentation of your stone has been and how much of the stone has passed. Your health care provider will check to see whether or not stone particles remain. SEEK IMMEDIATE MEDICAL CARE IF:  You develop a fever or shaking chills.  Your pain is not relieved by medicine.  You feel sick to your stomach (nauseated) and you vomit.  You develop heavy bleeding.  You have difficulty urinating.  You start to pass your stent from your penis.   This information is not intended to replace advice given to you by your health care provider. Make sure you discuss any questions you have with your health care provider.   Document Released: 01/29/2000 Document Revised: 02/21/2014 Document Reviewed: 08/16/2012 Elsevier Interactive Patient Education [employer].

## 2014-12-26 ENCOUNTER — Encounter: Payer: Self-pay | Admitting: Urology

## 2015-01-01 DIAGNOSIS — N200 Calculus of kidney: Secondary | ICD-10-CM | POA: Diagnosis not present

## 2015-01-01 DIAGNOSIS — R3121 Asymptomatic microscopic hematuria: Secondary | ICD-10-CM | POA: Diagnosis not present

## 2015-01-05 ENCOUNTER — Ambulatory Visit (INDEPENDENT_AMBULATORY_CARE_PROVIDER_SITE_OTHER): Payer: Medicare Other | Admitting: Family Medicine

## 2015-01-05 ENCOUNTER — Telehealth: Payer: Self-pay | Admitting: Family Medicine

## 2015-01-05 ENCOUNTER — Encounter: Payer: Self-pay | Admitting: Family Medicine

## 2015-01-05 VITALS — BP 129/79 | HR 88 | Temp 98.9°F | Ht 66.0 in | Wt 118.0 lb

## 2015-01-05 DIAGNOSIS — R3129 Other microscopic hematuria: Secondary | ICD-10-CM

## 2015-01-05 DIAGNOSIS — R634 Abnormal weight loss: Secondary | ICD-10-CM | POA: Diagnosis not present

## 2015-01-05 DIAGNOSIS — T887XXD Unspecified adverse effect of drug or medicament, subsequent encounter: Secondary | ICD-10-CM | POA: Diagnosis not present

## 2015-01-05 DIAGNOSIS — T50905A Adverse effect of unspecified drugs, medicaments and biological substances, initial encounter: Secondary | ICD-10-CM | POA: Insufficient documentation

## 2015-01-05 DIAGNOSIS — R9431 Abnormal electrocardiogram [ECG] [EKG]: Secondary | ICD-10-CM | POA: Diagnosis not present

## 2015-01-05 DIAGNOSIS — Z23 Encounter for immunization: Secondary | ICD-10-CM

## 2015-01-05 DIAGNOSIS — T50905D Adverse effect of unspecified drugs, medicaments and biological substances, subsequent encounter: Secondary | ICD-10-CM

## 2015-01-05 LAB — MICROSCOPIC EXAMINATION

## 2015-01-05 NOTE — Assessment & Plan Note (Addendum)
Check urine today; 1+ blood on dip, but 0-2 RBCs/hpf on the micro; sending to urologist and will ask his opinion

## 2015-01-05 NOTE — Telephone Encounter (Signed)
Pt called stated she has things she needs to tell Dr. Sanda Klein needs an appt ASAP. Very pushy. Pt informed we did not have anything available this week, pt stated to tell Dr. Sanda Klein who called she would make room for her. Please call her ASAP. Thanks.

## 2015-01-05 NOTE — Telephone Encounter (Signed)
She had lithotripsy and they gave her levaquin and had a lot of side effects from that; they did a heart thing and had a funny heart thing; the doctor wants to do a bladder scan next Thursday; I'll see her at 1:30 pm today

## 2015-01-05 NOTE — Progress Notes (Signed)
BP 129/79 mmHg  Pulse 88  Temp(Src) 98.9 F (37.2 C)  Ht 5\' 6"  (1.676 m)  Wt 118 lb (53.524 kg)  BMI 19.05 kg/m2  SpO2 99%   Subjective:    Patient ID: Shannon Weber, female    DOB: 1950/03/18, 64 y.o.   MRN: [id number]  HPI: LESSLI DEBATES is a 64 y.o. female  Chief Complaint  Patient presents with  . Heart Issues    She had taken Levaquin s/p Litotripsy last week and started having extreme side effects with it, anxiety, shakey/jittery, didn't feel herself. They did a heart rhythm strip and it showed some abnormalities   Lithotripsy was 10 days ago; patient wonders if recheck urine warranted before undergoing a cystoscope since they want her to take another antibiotic and she is concerned about having to do that again; she says that the George made her lose her mind She is terribly shaky and anxious, trouble concentrating, memory issues; she discussed it with the urologist and she found out that has many side effects She has lost more weight when I looked, but patient hasn't eaten lunch yet she says She is supposed to have cystoscopy next Thursday She brought in a rhythm strip from during her lithotripsy; there is no 12-lead, just the rhythm strip; she wasn't told anything specific, just to follow-up with her primary doctor about this; she is not sure if this tracing was done before or after she got the Levaquin; then she recalls that this must have been after the antibiotic She thinks her weight is down because couldn't eat because she was doing the cleanse; no coughing up blood, no night sweats; no abd pain  Relevant past medical, surgical, family and social history reviewed and updated as indicated. Father had valve replacement  Interim medical history since our last visit reviewed. Allergies and medications reviewed and updated.  Review of Systems Per HPI unless specifically indicated above  Aspirin on hold for now because of procedures Had mole removed on the back, needs  more of that removed    Objective:    BP 129/79 mmHg  Pulse 88  Temp(Src) 98.9 F (37.2 C)  Ht 5\' 6"  (1.676 m)  Wt 118 lb (53.524 kg)  BMI 19.05 kg/m2  SpO2 99%  Wt Readings from Last 3 Encounters:  01/05/15 118 lb (53.524 kg)  12/25/14 120 lb (54.432 kg)  12/11/14 121 lb 12.8 oz (55.248 kg)    Physical Exam  Constitutional: She appears well-developed and well-nourished.  Cardiovascular: Normal rate, regular rhythm and normal heart sounds.  Exam reveals no gallop and no friction rub.   No murmur heard. Pulmonary/Chest: Effort normal and breath sounds normal. No respiratory distress. She has no wheezes. She has no rales.  Neurological: She is alert.  Skin: No pallor.  Psychiatric: She has a normal mood and affect. Her behavior is normal. Judgment and thought content normal.    Results for orders placed or performed in visit on 01/05/15  Microscopic Examination  Result Value Ref Range   WBC, UA 0-5 0 -  5 /hpf   RBC, UA 0-2 0 -  2 /hpf   Epithelial Cells (non renal) 0-10 0 - 10 /hpf   Bacteria, UA Few None seen/Few  UA/M w/rflx Culture, Routine  Result Value Ref Range   Urine Culture, Routine Final report    Urine Culture result 1 Comment       Assessment & Plan:   Problem List Items Addressed This Visit  Genitourinary   Hematuria, microscopic - Primary    Check urine today; 1+ blood on dip, but 0-2 RBCs/hpf on the micro; sending to urologist and will ask his opinion      Relevant Orders   UA/M w/rflx Culture, Routine (Completed)     Other   Weight loss, unintentional    We discussed this again today; recent labs and scans reviewed; no red flags for malignancy in terms of night sweats, hemoptysis, abd pain, etc.; she will call if anything develops or declares itself, and she knows I will work this up and to call me if ongoing      Abnormal EKG    Will refer to cardiologist; I only have the rhythm strip from the procedure; EKG done today; mildly abnormal;  refer to cardiologist      Relevant Orders   EKG 12-Lead (Completed)   Ambulatory referral to Cardiology   Adverse drug reaction    Suspect the Levaquin, though I do not know what other medicines were given to her at the time of the lithotripsy; she is very concerned about getting fluoroquinolone again, and therefore is similarly concerned about the need for something like that prior to the cystoscopy; we'll send urine from today to urologist and pose question to him       Other Visit Diagnoses    Encounter for immunization        Needs flu shot        flu vaccine given today; see AVS       Follow up plan: No Follow-up on file. -- PRN  Orders Placed This Encounter  Procedures  . Microscopic Examination  . Flu Vaccine QUAD 36+ mos IM  . UA/M w/rflx Culture, Routine  . Ambulatory referral to Cardiology  . EKG 12-Lead   An after-visit summary was printed and given to the patient at [place].  Please see the patient instructions which may contain other information and recommendations beyond what is mentioned above in the assessment and plan. Face-to-face time with patient was more than 25 minutes, >50% time spent counseling and coordination of care

## 2015-01-05 NOTE — Patient Instructions (Addendum)
Please ask your mother if your father had a mitral valve replacement or aortic valve replacement (or other) and why if they know We'll have you see the cardiologist Do not take any more Levaquin and take the rhythm strip with you to the heart doctor appointment and he can calculate the QT interval off of that Other medicines to avoid for now include azithromycin (Z-pak) and ondansetron (Zofran) Call me if weight loss continues

## 2015-01-05 NOTE — Telephone Encounter (Signed)
Dr. Sanda Klein, please advise, you have 16 patients today, with 2 new patients this afternoon and 9 patients tomorrow morning.

## 2015-01-05 NOTE — Telephone Encounter (Signed)
I appear to have an opening at 1:30 pm today

## 2015-01-06 ENCOUNTER — Telehealth: Payer: Self-pay

## 2015-01-06 ENCOUNTER — Telehealth: Payer: Self-pay | Admitting: Family Medicine

## 2015-01-06 NOTE — Telephone Encounter (Signed)
Dr. Yves Dill called; he really believes she needs the cystoscopy; he does not think the levaquin is the cause of the reaction, more of the other meds She had atypical cells on cytology He asked if I could encourage her to get cystoscopy

## 2015-01-06 NOTE — Telephone Encounter (Signed)
I talked with the patient about the conversation with Dr. Yves Dill; explained that I do want her to follow-up and have the cystoscopy done; she does not want to go tomorrow, though, but she will contact his office I explained that he found atypical cells in her urine and very much wants her to have the cystoscopy; just because she had the kidney stones doesn't mean that there might not be something else going on I am here to help, so if there is anything I can do, please call me; she agrees

## 2015-01-06 NOTE — Telephone Encounter (Signed)
Patient called and stated that Dr. Letta Kocher office did get the letter from Dr. Sanda Klein. She is not planning on having the cystoscopy done at this time.

## 2015-01-07 LAB — UA/M W/RFLX CULTURE, ROUTINE

## 2015-01-11 NOTE — Assessment & Plan Note (Signed)
Suspect the Levaquin, though I do not know what other medicines were given to her at the time of the lithotripsy; she is very concerned about getting fluoroquinolone again, and therefore is similarly concerned about the need for something like that prior to the cystoscopy; we'll send urine from today to urologist and pose question to him

## 2015-01-11 NOTE — Assessment & Plan Note (Signed)
Will refer to cardiologist; I only have the rhythm strip from the procedure; EKG done today; mildly abnormal; refer to cardiologist

## 2015-01-11 NOTE — Assessment & Plan Note (Signed)
We discussed this again today; recent labs and scans reviewed; no red flags for malignancy in terms of night sweats, hemoptysis, abd pain, etc.; she will call if anything develops or declares itself, and she knows I will work this up and to call me if ongoing

## 2015-01-21 ENCOUNTER — Ambulatory Visit (INDEPENDENT_AMBULATORY_CARE_PROVIDER_SITE_OTHER): Payer: Medicare Other | Admitting: Family Medicine

## 2015-01-21 ENCOUNTER — Encounter: Payer: Self-pay | Admitting: Family Medicine

## 2015-01-21 VITALS — BP 133/68 | HR 80 | Temp 97.9°F | Wt 119.0 lb

## 2015-01-21 DIAGNOSIS — J0100 Acute maxillary sinusitis, unspecified: Secondary | ICD-10-CM | POA: Diagnosis not present

## 2015-01-21 MED ORDER — FLUCONAZOLE 150 MG PO TABS: 150.0000 mg | ORAL_TABLET | Freq: Once | ORAL | Status: DC

## 2015-01-21 MED ORDER — AMOXICILLIN-POT CLAVULANATE 875-125 MG PO TABS: 1.0000 | ORAL_TABLET | Freq: Two times a day (BID) | ORAL | Status: DC

## 2015-01-21 MED ORDER — ESTRADIOL 0.1 MG/GM VA CREA: 1.0000 | TOPICAL_CREAM | VAGINAL | Status: DC

## 2015-01-21 NOTE — Patient Instructions (Addendum)
Try vitamin C (orange juice if not diabetic or vitamin C tablets) and drink green tea to help your immune system during your illness Get plenty of rest and hydration Please do eat yogurt daily or take a probiotic daily for the next month or two We want to replace the healthy germs in the gut If you notice foul, watery diarrhea in the next two months, schedule an appointment RIGHT AWAY Start the antibiotics Use the fluconazole if needed for a yeast infection  Sinusitis, Adult Sinusitis is redness, soreness, and puffiness (inflammation) of the air pockets in the bones of your face (sinuses). The redness, soreness, and puffiness can cause air and mucus to get trapped in your sinuses. This can allow germs to grow and cause an infection.  HOME CARE   Drink enough fluids to keep your pee (urine) clear or pale yellow.  Use a humidifier in your home.  Run a hot shower to create steam in the bathroom. Sit in the bathroom with the door closed. Breathe in the steam 3-4 times a day.  Put a warm, moist washcloth on your face 3-4 times a day, or as told by your doctor.  Use salt water sprays (saline sprays) to wet the thick fluid in your nose. This can help the sinuses drain.  Only take medicine as told by your doctor. GET HELP RIGHT AWAY IF:   Your pain gets worse.  You have very bad headaches.  You are sick to your stomach (nauseous).  You throw up (vomit).  You are very sleepy (drowsy) all the time.  Your face is puffy (swollen).  Your vision changes.  You have a stiff neck.  You have trouble breathing. MAKE SURE YOU:   Understand these instructions.  Will watch your condition.  Will get help right away if you are not doing well or get worse.   This information is not intended to replace advice given to you by your health care provider. Make sure you discuss any questions you have with your health care provider.   Document Released: 07/20/2007 Document Revised: 02/21/2014  Document Reviewed: 09/06/2011 Elsevier Interactive Patient Education [employer].

## 2015-01-21 NOTE — Progress Notes (Signed)
BP 133/68 mmHg  Pulse 80  Temp(Src) 97.9 F (36.6 C)  Wt 119 lb (53.978 kg)  SpO2 99%   Subjective:    Patient ID: Shannon Weber, female    DOB: 31-Oct-1950, 64 y.o.   MRN: [id number]  HPI: Shannon Weber is a 64 y.o. female  Chief Complaint  Patient presents with  . URI    sick for 10 days and feels like it is getting worse.   She has been sick for 10 days; the sneezing and running nose are some better Her sinuses hurt; did the hot water and the steam Chest feels heavy Coughing some, worse when laying down; propping up with pills Some phlegm, pretty clear, now yellow Both sinuses, worse on the left No tooth pain Ears are stuffy No rash No travel No sick contacts Feels really draggy Sugar free cough drops Taking mucinex dm  She would like refill of estrogen cream; uses just a tiny amount, NOT every night, just twice a week  Relevant past medical, surgical history reviewed and updated as indicated. Interim medical history since our last visit reviewed. Going to have cystoscopy after all Allergies and medications reviewed and updated.  Review of Systems Per HPI unless specifically indicated above     Objective:    BP 133/68 mmHg  Pulse 80  Temp(Src) 97.9 F (36.6 C)  Wt 119 lb (53.978 kg)  SpO2 99%  Wt Readings from Last 3 Encounters:  01/21/15 119 lb (53.978 kg)  01/05/15 118 lb (53.524 kg)  12/25/14 120 lb (54.432 kg)    Physical Exam  Constitutional: She appears well-developed and well-nourished.  Eyes: EOM are normal. Right eye exhibits no discharge. Left eye exhibits no discharge. No scleral icterus.  Cardiovascular: Normal rate and regular rhythm.   Pulmonary/Chest: Effort normal and breath sounds normal.  Skin: No pallor.  Psychiatric: She has a normal mood and affect.   Results for orders placed or performed in visit on 01/05/15  Microscopic Examination  Result Value Ref Range   WBC, UA 0-5 0 -  5 /hpf   RBC, UA 0-2 0 -  2 /hpf   Epithelial  Cells (non renal) 0-10 0 - 10 /hpf   Bacteria, UA Few None seen/Few  UA/M w/rflx Culture, Routine  Result Value Ref Range   Urine Culture, Routine Final report    Urine Culture result 1 Comment       Assessment & Plan:   Problem List Items Addressed This Visit    None    Visit Diagnoses    Acute maxillary sinusitis, recurrence not specified    -  Primary    rest, hydration; will choose antibiotic that provide coverage prior to cystoscopy; see AVS    Relevant Medications    amoxicillin-clavulanate (AUGMENTIN) 875-125 MG tablet    fluconazole (DIFLUCAN) 150 MG tablet       Follow up plan: Return if symptoms worsen or fail to improve.  An after-visit summary was printed and given to the patient at [place].  Please see the patient instructions which may contain other information and recommendations beyond what is mentioned above in the assessment and plan.  Meds ordered this encounter  Medications  . amoxicillin-clavulanate (AUGMENTIN) 875-125 MG tablet    Sig: Take 1 tablet by mouth 2 (two) times daily.    Dispense:  20 tablet    Refill:  0  . fluconazole (DIFLUCAN) 150 MG tablet    Sig: Take 1 tablet (150 mg total) by  mouth once.    Dispense:  1 tablet    Refill:  0  . estradiol (ESTRACE) 0.1 MG/GM vaginal cream    Sig: Place 1 Applicatorful vaginally 2 (two) times a week.    Dispense:  42.5 g    Refill:  11

## 2015-01-27 ENCOUNTER — Ambulatory Visit (INDEPENDENT_AMBULATORY_CARE_PROVIDER_SITE_OTHER): Payer: Medicare Other | Admitting: Cardiovascular Disease

## 2015-01-27 ENCOUNTER — Encounter: Payer: Self-pay | Admitting: Cardiovascular Disease

## 2015-01-27 VITALS — BP 100/62 | HR 88 | Ht 66.0 in | Wt 118.8 lb

## 2015-01-27 DIAGNOSIS — R9431 Abnormal electrocardiogram [ECG] [EKG]: Secondary | ICD-10-CM

## 2015-01-27 DIAGNOSIS — I493 Ventricular premature depolarization: Secondary | ICD-10-CM | POA: Diagnosis not present

## 2015-01-27 DIAGNOSIS — R002 Palpitations: Secondary | ICD-10-CM | POA: Diagnosis not present

## 2015-01-27 NOTE — Assessment & Plan Note (Signed)
EKG today is normal. Recent EKG showed inverted T wave in V1 and V2 which is a normal variant.

## 2015-01-27 NOTE — Assessment & Plan Note (Signed)
Recent PVCs happened in the setting of treatment with Levaquin which can cause arrhythmia. Given the other side effects she had with his medication, avoid fluoroquinolones in the future. Nonetheless, back treatment was weeks ago and she is noted today to continue to have PVCs. Symptoms include mild palpitations. She has no symptoms suggestive of angina or heart failure. I am going to obtain a 24-hour Holter monitor to quantify the burden of PVCs. I'm also obtaining an echocardiogram to ensure  that she doesn't have cardiomyopathy or other structural abnormalities. If the burden of PVCs is no an echo is unremarkable, then no further treatment is needed given that her symptoms are overall mild.

## 2015-01-27 NOTE — Progress Notes (Signed)
Primary care physician: Dr. Sanda Klein  HPI  This is a 64 year old female who was referred for evaluation of an abnormal EKG and PVCs. She has no previous cardiac history and overall has been healthy throughout her life with no significant chronic medical conditions. She is not a smoker. Family history reveals that her father had extensive cardiac problems including chronic systolic heart failure. He died at the age of 35 from heart disease. The patient recently had lithotripsy done. She was given prophylactic Levaquin. She had significant side effects with the medication which caused confusion and not feeling well. During lithotripsy, she was noted to have frequent PVCs. The patient complains of mild palpitations but overall she denies any chest pain or shortness of breath. No syncope or presyncope. She does not consume excessive amount of caffeine.  Allergies  Allergen Reactions  . Levaquin [Levofloxacin In D5w] Anxiety and Other (See Comments)    Had anxiety, shakey, "not herself"     Current Outpatient Prescriptions on File Prior to Visit  Medication Sig Dispense Refill  . ALOE VERA PO Take by mouth daily.    Marland Kitchen amoxicillin-clavulanate (AUGMENTIN) 875-125 MG tablet Take 1 tablet by mouth 2 (two) times daily. 20 tablet 0  . Cholecalciferol (VITAMIN D PO) Take by mouth daily.    Marland Kitchen estradiol (ESTRACE) 0.1 MG/GM vaginal cream Place 1 Applicatorful vaginally 2 (two) times a week. 42.5 g 11  . fluconazole (DIFLUCAN) 150 MG tablet Take 1 tablet (150 mg total) by mouth once. 1 tablet 0  . Misc Natural Products (GLUCOSAMINE CHONDROITIN ADV PO) Take by mouth 2 (two) times daily.     . Multiple Vitamins-Minerals (MULTIVITAMIN & MINERAL PO) Take 500 mg by mouth daily.    . Probiotic Product ([place]) Take by mouth daily.    Marland Kitchen pyridOXINE (VITAMIN B-6) 100 MG tablet Take 100 mg by mouth daily.    . sertraline (ZOLOFT) 50 MG tablet Take 0.5 tablets (25 mg total) by mouth daily. STOP  fluoxetine 15 tablet 11   No current facility-administered medications on file prior to visit.     Past Medical History  Diagnosis Date  . Onychomycosis   . Hypercholesteremia   . 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 Abnormal serum lipase level   . Inflammatory polyps     Gall bladder (2)  . Chocolate cyst of ovary   . Fibroid      Past Surgical History  Procedure Laterality Date  . Abdominal hysterectomy    . Colonoscopy w/ polypectomy    . Colonoscopy  2014  . Breast cyst aspiration Right 2008    negative  . Breast biopsy Left 1980's    negative  . Mole removed    . Extracorporeal shock wave lithotripsy Left 12/25/2014    Procedure: EXTRACORPOREAL SHOCK WAVE LITHOTRIPSY (ESWL);  Surgeon: Royston Cowper, MD;  Location: [facility] ORS;  Service: Urology;  Laterality: Left;     Family History  Problem Relation Age of Onset  . Goiter Mother   . Cancer Father     colon  . Hyperlipidemia Father   . Heart disease Father 31  . Breast cancer Maternal Aunt   . Breast cancer Paternal Aunt      Social History   Social History  . Marital Status: Married    Spouse Name: N/A  . Number of Children: N/A  . Years of Education: N/A   Occupational History  . Not on file.   Social History  Main Topics  . Smoking status: Never Smoker   . Smokeless tobacco: Never Used  . Alcohol Use: No  . Drug Use: No  . Sexual Activity: Not on file   Other Topics Concern  . Not on file   Social History Narrative     ROS A 10 point review of system was performed. It is negative other than that mentioned in the history of present illness.   PHYSICAL EXAM   BP 100/62 mmHg  Pulse 88  Ht 5\' 6"  (1.676 m)  Wt 118 lb 12 oz (53.865 kg)  BMI 19.18 kg/m2 Constitutional: She is oriented to person, place, and time. She appears well-developed and well-nourished. No distress.  HENT: No nasal discharge.  Head: Normocephalic and atraumatic.  Eyes: Pupils are equal and round. No  discharge.  Neck: Normal range of motion. Neck supple. No JVD present. No thyromegaly present.  Cardiovascular: Normal rate, regular rhythm, normal heart sounds. Exam reveals no gallop and no friction rub. No murmur heard.  Pulmonary/Chest: Effort normal and breath sounds normal. No stridor. No respiratory distress. She has no wheezes. She has no rales. She exhibits no tenderness.  Abdominal: Soft. Bowel sounds are normal. She exhibits no distension. There is no tenderness. There is no rebound and no guarding.  Musculoskeletal: Normal range of motion. She exhibits no edema and no tenderness.  Neurological: She is alert and oriented to person, place, and time. Coordination normal.  Skin: Skin is warm and dry. No rash noted. She is not diaphoretic. No erythema. No pallor.  Psychiatric: She has a normal mood and affect. Her behavior is normal. Judgment and thought content normal.     EKG: Normal sinus rhythm with PVCs. No significant ST or T wave changes. Normal PR and QT intervals.   Recent EKG showed sinus rhythm with T-wave inversion in V1 and V2 which is likely a normal variant.   ASSESSMENT AND PLAN

## 2015-01-27 NOTE — Patient Instructions (Signed)
Medication Instructions:  Your physician recommends that you continue on your current medications as directed. Please refer to the Current Medication list given to you today.   Labwork: none  Testing/Procedures: Your physician has recommended that you wear a holter monitor. Holter monitors are medical devices that record the heart's electrical activity. Doctors most often use these monitors to diagnose arrhythmias. Arrhythmias are problems with the speed or rhythm of the heartbeat. The monitor is a small, portable device. You can wear one while you do your normal daily activities. This is usually used to diagnose what is causing palpitations/syncope (passing out).  Your physician has requested that you have an echocardiogram. Echocardiography is a painless test that uses sound waves to create images of your heart. It provides your doctor with information about the size and shape of your heart and how well your heart's chambers and valves are working. This procedure takes approximately one hour. There are no restrictions for this procedure.    Follow-Up: Your physician recommends that you schedule a follow-up appointment as needed with Dr. Fletcher Anon.     Any Other Special Instructions Will Be Listed Below (If Applicable).     If you need a refill on your cardiac medications before your next appointment, please call your pharmacy.  Echocardiogram An echocardiogram, or echocardiography, uses sound waves (ultrasound) to produce an image of your heart. The echocardiogram is simple, painless, obtained within a short period of time, and offers valuable information to your health care provider. The images from an echocardiogram can provide information such as:  Evidence of coronary artery disease (CAD).  Heart size.  Heart muscle function.  Heart valve function.  Aneurysm detection.  Evidence of a past heart attack.  Fluid buildup around the heart.  Heart muscle thickening.  Assess  heart valve function. LET [facility] CARE PROVIDER KNOW ABOUT:  Any allergies you have.  All medicines you are taking, including vitamins, herbs, eye drops, creams, and over-the-counter medicines.  Previous problems you or members of your family have had with the use of anesthetics.  Any blood disorders you have.  Previous surgeries you have had.  Medical conditions you have.  Possibility of pregnancy, if this applies. BEFORE THE PROCEDURE  No special preparation is needed. Eat and drink normally.  PROCEDURE   In order to produce an image of your heart, gel will be applied to your chest and a wand-like tool (transducer) will be moved over your chest. The gel will help transmit the sound waves from the transducer. The sound waves will harmlessly bounce off your heart to allow the heart images to be captured in real-time motion. These images will then be recorded.  You may need an IV to receive a medicine that improves the quality of the pictures. AFTER THE PROCEDURE You may return to your normal schedule including diet, activities, and medicines, unless your health care provider tells you otherwise.   This information is not intended to replace advice given to you by your health care provider. Make sure you discuss any questions you have with your health care provider.   Document Released: 01/29/2000 Document Revised: 02/21/2014 Document Reviewed: 10/08/2012 Elsevier Interactive Patient Education 2016 [employer]. Cardiac Event Monitoring A cardiac event monitor is a small recording device used to help detect abnormal heart rhythms (arrhythmias). The monitor is used to record heart rhythm when noticeable symptoms such as the following occur:  Fast heartbeats (palpitations), such as heart racing or fluttering.  Dizziness.  Fainting or light-headedness.  Unexplained weakness. The monitor is wired to two electrodes placed on your chest. Electrodes are flat, sticky disks that  attach to your skin. The monitor can be worn for up to 30 days. You will wear the monitor at all times, except when bathing.  HOW TO USE YOUR CARDIAC EVENT MONITOR A technician will prepare your chest for the electrode placement. The technician will show you how to place the electrodes, how to work the monitor, and how to replace the batteries. Take time to practice using the monitor before you leave the office. Make sure you understand how to send the information from the monitor to your health care provider. This requires a telephone with a landline, not a cell phone. You need to:  Wear your monitor at all times, except when you are in water:  Do not get the monitor wet.  Take the monitor off when bathing. Do not swim or use a hot tub with it on.  Keep your skin clean. Do not put body lotion or moisturizer on your chest.  Change the electrodes daily or any time they stop sticking to your skin. You might need to use tape to keep them on.  It is possible that your skin under the electrodes could become irritated. To keep this from happening, try to put the electrodes in slightly different places on your chest. However, they must remain in the area under your left breast and in the upper right section of your chest.  Make sure the monitor is safely clipped to your clothing or in a location close to your body that your health care provider recommends.  Press the button to record when you feel symptoms of heart trouble, such as dizziness, weakness, light-headedness, palpitations, thumping, shortness of breath, unexplained weakness, or a fluttering or racing heart. The monitor is always on and records what happened slightly before you pressed the button, so do not worry about being too late to get good information.  Keep a diary of your activities, such as walking, doing chores, and taking medicine. It is especially important to note what you were doing when you pushed the button to record your  symptoms. This will help your health care provider determine what might be contributing to your symptoms. The information stored in your monitor will be reviewed by your health care provider alongside your diary entries.  Send the recorded information as recommended by your health care provider. It is important to understand that it will take some time for your health care provider to process the results.  Change the batteries as recommended by your health care provider. SEEK IMMEDIATE MEDICAL CARE IF:   You have chest pain.  You have extreme difficulty breathing or shortness of breath.  You develop a very fast heartbeat that persists.  You develop dizziness that does not go away.  You faint or constantly feel you are about to faint.   This information is not intended to replace advice given to you by your health care provider. Make sure you discuss any questions you have with your health care provider.   Document Released: 11/10/2007 Document Revised: 02/21/2014 Document Reviewed: 07/30/2012 Elsevier Interactive Patient Education [employer].

## 2015-01-29 DIAGNOSIS — R3121 Asymptomatic microscopic hematuria: Secondary | ICD-10-CM | POA: Diagnosis not present

## 2015-01-29 DIAGNOSIS — N200 Calculus of kidney: Secondary | ICD-10-CM | POA: Diagnosis not present

## 2015-02-03 DIAGNOSIS — D485 Neoplasm of uncertain behavior of skin: Secondary | ICD-10-CM | POA: Diagnosis not present

## 2015-02-03 DIAGNOSIS — L905 Scar conditions and fibrosis of skin: Secondary | ICD-10-CM | POA: Diagnosis not present

## 2015-02-17 ENCOUNTER — Ambulatory Visit (INDEPENDENT_AMBULATORY_CARE_PROVIDER_SITE_OTHER): Payer: Medicare Other

## 2015-02-17 ENCOUNTER — Other Ambulatory Visit: Payer: Self-pay

## 2015-02-17 DIAGNOSIS — I493 Ventricular premature depolarization: Secondary | ICD-10-CM

## 2015-02-19 ENCOUNTER — Ambulatory Visit: Payer: No Typology Code available for payment source | Admitting: Nurse Practitioner

## 2015-03-04 ENCOUNTER — Telehealth: Payer: Self-pay | Admitting: *Deleted

## 2015-03-04 NOTE — Telephone Encounter (Signed)
Pt calling to get her monitor results. She did it a few weeks ago.  Never heard anything from [place] Please advise.

## 2015-03-04 NOTE — Telephone Encounter (Signed)
Forward to MD for review.

## 2015-03-04 NOTE — Telephone Encounter (Signed)
See result note.  

## 2015-03-05 NOTE — Telephone Encounter (Signed)
Reviewed results w/pt. See holter monitor results

## 2015-03-18 HISTORY — PX: TOTAL HIP ARTHROPLASTY: SHX124

## 2015-03-26 DIAGNOSIS — M1611 Unilateral primary osteoarthritis, right hip: Secondary | ICD-10-CM | POA: Diagnosis not present

## 2015-04-01 DIAGNOSIS — M1611 Unilateral primary osteoarthritis, right hip: Secondary | ICD-10-CM | POA: Diagnosis not present

## 2015-04-01 DIAGNOSIS — Z01818 Encounter for other preprocedural examination: Secondary | ICD-10-CM | POA: Diagnosis not present

## 2015-04-01 DIAGNOSIS — K219 Gastro-esophageal reflux disease without esophagitis: Secondary | ICD-10-CM | POA: Insufficient documentation

## 2015-04-01 DIAGNOSIS — F329 Major depressive disorder, single episode, unspecified: Secondary | ICD-10-CM | POA: Diagnosis not present

## 2015-04-01 DIAGNOSIS — F32A Depression, unspecified: Secondary | ICD-10-CM | POA: Insufficient documentation

## 2015-04-04 ENCOUNTER — Encounter: Payer: Self-pay | Admitting: Family Medicine

## 2015-04-04 DIAGNOSIS — Z23 Encounter for immunization: Secondary | ICD-10-CM

## 2015-04-06 DIAGNOSIS — Z23 Encounter for immunization: Secondary | ICD-10-CM | POA: Insufficient documentation

## 2015-04-06 NOTE — Telephone Encounter (Signed)
Okay for PCV-13 vaccine x 1

## 2015-04-07 ENCOUNTER — Ambulatory Visit (INDEPENDENT_AMBULATORY_CARE_PROVIDER_SITE_OTHER): Payer: Medicare Other

## 2015-04-07 DIAGNOSIS — Z23 Encounter for immunization: Secondary | ICD-10-CM

## 2015-04-10 ENCOUNTER — Encounter: Payer: Self-pay | Admitting: Family Medicine

## 2015-04-10 ENCOUNTER — Ambulatory Visit
Admission: RE | Admit: 2015-04-10 | Discharge: 2015-04-10 | Disposition: A | Discharge status: Home / Self Care | Payer: Medicare Other | Source: Ambulatory Visit | Attending: Family Medicine | Admitting: Family Medicine

## 2015-04-10 ENCOUNTER — Telehealth: Payer: Self-pay | Admitting: Family Medicine

## 2015-04-10 ENCOUNTER — Ambulatory Visit (INDEPENDENT_AMBULATORY_CARE_PROVIDER_SITE_OTHER): Payer: Medicare Other | Admitting: Family Medicine

## 2015-04-10 VITALS — BP 122/71 | HR 89 | Temp 100.4°F | Ht 66.0 in | Wt 117.0 lb

## 2015-04-10 DIAGNOSIS — M1990 Unspecified osteoarthritis, unspecified site: Secondary | ICD-10-CM | POA: Diagnosis not present

## 2015-04-10 DIAGNOSIS — M25559 Pain in unspecified hip: Secondary | ICD-10-CM

## 2015-04-10 DIAGNOSIS — M1610 Unilateral primary osteoarthritis, unspecified hip: Secondary | ICD-10-CM

## 2015-04-10 DIAGNOSIS — R059 Cough, unspecified: Secondary | ICD-10-CM

## 2015-04-10 DIAGNOSIS — R05 Cough: Secondary | ICD-10-CM

## 2015-04-10 LAB — CBC WITH DIFFERENTIAL/PLATELET
HEMATOCRIT: 38.5 % (ref 34.0–46.6)
HEMOGLOBIN: 13.2 g/dL (ref 11.1–15.9)
LYMPHS ABS: 0.9 10*3/uL (ref 0.7–3.1)
Lymphs: 14 %
MCH: 31.1 pg (ref 26.6–33.0)
MCHC: 34.3 g/dL (ref 31.5–35.7)
MCV: 91 fL (ref 79–97)
MID (Absolute): 0.5 10*3/uL (ref 0.1–1.6)
MID: 8 %
NEUTROS ABS: 5.2 10*3/uL (ref 1.4–7.0)
Neutrophils: 78 %
Platelets: 162 10*3/uL (ref 150–379)
RBC: 4.24 x10E6/uL (ref 3.77–5.28)
RDW: 13.9 % (ref 12.3–15.4)
WBC: 6.6 10*3/uL (ref 3.4–10.8)

## 2015-04-10 MED ORDER — AZITHROMYCIN 250 MG PO TABS
ORAL_TABLET | ORAL | Status: AC
Start: 2015-04-10 — End: 2015-04-15

## 2015-04-10 MED ORDER — AMOXICILLIN 500 MG PO CAPS: 1000.0000 mg | ORAL_CAPSULE | Freq: Three times a day (TID) | ORAL | Status: AC

## 2015-04-10 NOTE — Telephone Encounter (Signed)
Pt came in and stated if we do not have x ray results by 1pm today to call [facility] and let them know pt is having surgery Monday and we need the results today if possible. Thanks   Share results with Dr. Claiborne Billings

## 2015-04-10 NOTE — Telephone Encounter (Signed)
Image resulted. Routing to provider.   CLINICAL DATA: Cough.  EXAM: CHEST 2 VIEW  COMPARISON: 10/26/2011.  FINDINGS: Mediastinum hilar structures normal. Lungs are clear of acute infiltrate. Apical pleural-parenchymal thickening consistent with scarring again noted. Heart size normal. No pleural effusion or pneumothorax. Thoracic spine degenerative changes scoliosis. No acute bony abnormality .  IMPRESSION: No acute cardiopulmonary disease.   Electronically Signed  By: Marcello Moores Register  On: 04/10/2015 12:05

## 2015-04-10 NOTE — Patient Instructions (Signed)
Try vitamin C (orange juice if not diabetic or vitamin C tablets) and drink green tea to help your immune system during your illness Get plenty of rest and hydration Go from here to [facility] to have your xray done Start the two new antibiotics Please do eat yogurt daily or take a probiotic daily for the next month or two We want to replace the healthy germs in the gut If you notice foul, watery diarrhea in the next two months, schedule an appointment RIGHT AWAY  I'll be moving to another practice, [facility], on May 18, 2015 I'll be here until May 15, 2015 You are welcome to either follow me there for ongoing care, or you can stay here at [facility] and see Dr. Park Liter or our nurse practitioner Kathrine Haddock  Call or go to urgent care or ER if getting worse

## 2015-04-10 NOTE — Progress Notes (Signed)
BP 122/71 mmHg  Pulse 89  Temp(Src) 100.4 F (38 C)  Ht 5\' 6"  (1.676 m)  Wt 117 lb (53.071 kg)  BMI 18.89 kg/m2  SpO2 100%   Subjective:    Patient ID: Shannon Weber, female    DOB: 02/06/1951, 65 y.o.   MRN: [id number]  HPI: Shannon Weber is a 65 y.o. female  Chief Complaint  Patient presents with  . Cough  . Other    Patient has surgery Monday and wants to keep that appointment. Needs to make sure lungs are clear.   . Medication Management    Patient is only taking her zoloft right now because of her surgery--still has and will use her other medications after surgery.    She was supposed to have hip replacement surgery Monday but is sick, needs to have lungs checked before she has surgery; she was so hoping to get her hip surgery before an upcoming trip to the [facility], that may not happen now Having coughing and sneezing and runny nose Cough started two days ago; was coughing up some stuff up; some color, yellow Low grade temp today, but no fever at home Maybe some chills at home on Wednesday Works in preschool Little bit of sore throat at first Monday or Tuesday, gone; no rash No travel Was not able to take any herbals; stood in the shower for steam; done the breathing exercises No wheezing No hx of pneumonia, then thinks maybe she did have a bout 30-35 years I brought up her weight and she says she has not been eating well with being sick; she has not felt like eating with this infection; down one pound since last visit  Past Medical History  Diagnosis Date  . Onychomycosis   . Hypercholesteremia   . Major depressive disorder in remission ([place])   . Abnormal serum lipase level   . Inflammatory polyps     Gall bladder (2)  . Chocolate cyst of ovary   . Fibroid    Past Surgical History  Procedure Laterality Date  . Abdominal hysterectomy    . Colonoscopy w/ polypectomy    . Colonoscopy  2014  . Breast cyst aspiration Right 2008    negative  . Breast  biopsy Left 1980's    negative  . Mole removed    . Extracorporeal shock wave lithotripsy Left 12/25/2014    Procedure: EXTRACORPOREAL SHOCK WAVE LITHOTRIPSY (ESWL);  Surgeon: Royston Cowper, MD;  Location: [facility] ORS;  Service: Urology;  Laterality: Left;   Social History  Substance Use Topics  . Smoking status: Never Smoker   . Smokeless tobacco: Never Used  . Alcohol Use: No   Relevant past medical, surgical, social history reviewed Medications reviewed  Allergies were not marked as having been checked by the CMA I personally reviewed the allergies with the patient and she confirms that she is allergic to Levaquin but no other antibiotics  Review of Systems Per HPI unless specifically indicated above     Objective:    BP 122/71 mmHg  Pulse 89  Temp(Src) 100.4 F (38 C)  Ht 5\' 6"  (1.676 m)  Wt 117 lb (53.071 kg)  BMI 18.89 kg/m2  SpO2 100%  Wt Readings from Last 3 Encounters:  04/10/15 117 lb (53.071 kg)  01/27/15 118 lb 12 oz (53.865 kg)  01/21/15 119 lb (53.978 kg)    Physical Exam  Constitutional: She appears well-developed. No distress.  Thin, weight down a little over one pound  in 2-1/2 months  HENT:  Right Ear: Hearing, tympanic membrane, external ear and ear canal normal.  Left Ear: Hearing, tympanic membrane, external ear and ear canal normal.  Nose: Rhinorrhea present.  Mouth/Throat: Oropharynx is clear and moist and mucous membranes are normal. No posterior oropharyngeal edema or posterior oropharyngeal erythema.  Cardiovascular: Normal rate and regular rhythm.   Pulmonary/Chest: Effort normal. No accessory muscle usage. No respiratory distress. She has no decreased breath sounds. She has no wheezes. She has rhonchi (scattered rhonchi, partially clear with cough, bilateral).  Musculoskeletal:  Antalgic gait  Lymphadenopathy:    She has no cervical adenopathy.  Skin: She is not diaphoretic. No pallor.  Psychiatric: She has a normal mood and affect.     Results for orders placed or performed in visit on 04/10/15  CBC With Differential/Platelet  Result Value Ref Range   WBC 6.6 3.4 - 10.8 x10E3/uL   RBC 4.24 3.77 - 5.28 x10E6/uL   Hemoglobin 13.2 11.1 - 15.9 g/dL   Hematocrit 38.5 34.0 - 46.6 %   MCV 91 79 - 97 fL   MCH 31.1 26.6 - 33.0 pg   MCHC 34.3 31.5 - 35.7 g/dL   RDW 13.9 12.3 - 15.4 %   Platelets 162 150 - 379 x10E3/uL   Neutrophils 78 %   Lymphs 14 %   MID 8 %   Neutrophils Absolute 5.2 1.4 - 7.0 x10E3/uL   Lymphocytes Absolute 0.9 0.7 - 3.1 x10E3/uL   MID (Absolute) 0.5 0.1 - 1.6 X10E3/uL      Assessment & Plan:   Problem List Items Addressed This Visit      Other   Arthritis pain, hip    Patient due for hip replacement on Monday; however, she is sick; suggested she postpone her procedure; if the delay in the procedure means that she won't be able to hike/enjoy the [facility], I am willing to write a note if there is anything to help her delay or change her plans from a financial standpoint       Other Visit Diagnoses    Cough    -  Primary    CBC done today; not elevated, but left shift; start antibiotics; CXR ordered; rest, hydration, etc.; to ER if worse    Relevant Orders    DG Chest 2 View (Completed)    CBC With Differential/Platelet (Completed)       Follow up plan: Return if symptoms worsen or fail to improve.  Meds ordered this encounter  Medications  . azithromycin (ZITHROMAX) 250 MG tablet    Sig: Two pills by mouth today, then one pill daily for four more days    Dispense:  6 tablet    Refill:  0  . amoxicillin (AMOXIL) 500 MG capsule    Sig: Take 2 capsules (1,000 mg total) by mouth 3 (three) times daily.    Dispense:  42 capsule    Refill:  0   An after-visit summary was printed and given to the patient at [place].  Please see the patient instructions which may contain other information and recommendations beyond what is mentioned above in the assessment and plan.

## 2015-04-10 NOTE — Telephone Encounter (Signed)
Imaging findings and CBC faxed to Dr. Georgina Peer at [phone]. Marked as Stat Review.

## 2015-04-10 NOTE — Telephone Encounter (Signed)
Yes, please share results with Dr. Claiborne Billings. I already released the results to her. Please send to him along with CBC.

## 2015-04-12 DIAGNOSIS — M1610 Unilateral primary osteoarthritis, unspecified hip: Secondary | ICD-10-CM | POA: Insufficient documentation

## 2015-04-12 NOTE — Assessment & Plan Note (Signed)
Patient due for hip replacement on Monday; however, she is sick; suggested she postpone her procedure; if the delay in the procedure means that she won't be able to hike/enjoy the [facility], I am willing to write a note if there is anything to help her delay or change her plans from a financial standpoint

## 2015-04-13 DIAGNOSIS — Z471 Aftercare following joint replacement surgery: Secondary | ICD-10-CM | POA: Diagnosis not present

## 2015-04-13 DIAGNOSIS — Z96641 Presence of right artificial hip joint: Secondary | ICD-10-CM | POA: Insufficient documentation

## 2015-04-13 DIAGNOSIS — I499 Cardiac arrhythmia, unspecified: Secondary | ICD-10-CM | POA: Diagnosis not present

## 2015-04-13 DIAGNOSIS — I493 Ventricular premature depolarization: Secondary | ICD-10-CM | POA: Diagnosis not present

## 2015-04-13 DIAGNOSIS — K219 Gastro-esophageal reflux disease without esophagitis: Secondary | ICD-10-CM | POA: Diagnosis not present

## 2015-04-13 DIAGNOSIS — F329 Major depressive disorder, single episode, unspecified: Secondary | ICD-10-CM | POA: Diagnosis not present

## 2015-04-13 DIAGNOSIS — M1611 Unilateral primary osteoarthritis, right hip: Secondary | ICD-10-CM | POA: Diagnosis not present

## 2015-04-13 HISTORY — PX: REPLACEMENT TOTAL HIP W/  RESURFACING IMPLANTS: SUR1222

## 2015-04-15 DIAGNOSIS — F329 Major depressive disorder, single episode, unspecified: Secondary | ICD-10-CM | POA: Diagnosis not present

## 2015-04-15 DIAGNOSIS — M6281 Muscle weakness (generalized): Secondary | ICD-10-CM | POA: Diagnosis not present

## 2015-04-15 DIAGNOSIS — Z79891 Long term (current) use of opiate analgesic: Secondary | ICD-10-CM | POA: Diagnosis not present

## 2015-04-15 DIAGNOSIS — Z96641 Presence of right artificial hip joint: Secondary | ICD-10-CM | POA: Diagnosis not present

## 2015-04-15 DIAGNOSIS — Z471 Aftercare following joint replacement surgery: Secondary | ICD-10-CM | POA: Diagnosis not present

## 2015-04-15 DIAGNOSIS — Z7982 Long term (current) use of aspirin: Secondary | ICD-10-CM | POA: Diagnosis not present

## 2015-04-17 DIAGNOSIS — Z96641 Presence of right artificial hip joint: Secondary | ICD-10-CM | POA: Diagnosis not present

## 2015-04-17 DIAGNOSIS — Z471 Aftercare following joint replacement surgery: Secondary | ICD-10-CM | POA: Diagnosis not present

## 2015-04-17 DIAGNOSIS — M6281 Muscle weakness (generalized): Secondary | ICD-10-CM | POA: Diagnosis not present

## 2015-04-17 DIAGNOSIS — Z79891 Long term (current) use of opiate analgesic: Secondary | ICD-10-CM | POA: Diagnosis not present

## 2015-04-17 DIAGNOSIS — Z7982 Long term (current) use of aspirin: Secondary | ICD-10-CM | POA: Diagnosis not present

## 2015-04-17 DIAGNOSIS — F329 Major depressive disorder, single episode, unspecified: Secondary | ICD-10-CM | POA: Diagnosis not present

## 2015-04-20 DIAGNOSIS — Z471 Aftercare following joint replacement surgery: Secondary | ICD-10-CM | POA: Diagnosis not present

## 2015-04-20 DIAGNOSIS — Z7982 Long term (current) use of aspirin: Secondary | ICD-10-CM | POA: Diagnosis not present

## 2015-04-20 DIAGNOSIS — Z79891 Long term (current) use of opiate analgesic: Secondary | ICD-10-CM | POA: Diagnosis not present

## 2015-04-20 DIAGNOSIS — Z96641 Presence of right artificial hip joint: Secondary | ICD-10-CM | POA: Diagnosis not present

## 2015-04-20 DIAGNOSIS — F329 Major depressive disorder, single episode, unspecified: Secondary | ICD-10-CM | POA: Diagnosis not present

## 2015-04-20 DIAGNOSIS — M6281 Muscle weakness (generalized): Secondary | ICD-10-CM | POA: Diagnosis not present

## 2015-04-22 DIAGNOSIS — F329 Major depressive disorder, single episode, unspecified: Secondary | ICD-10-CM | POA: Diagnosis not present

## 2015-04-22 DIAGNOSIS — Z79891 Long term (current) use of opiate analgesic: Secondary | ICD-10-CM | POA: Diagnosis not present

## 2015-04-22 DIAGNOSIS — Z7982 Long term (current) use of aspirin: Secondary | ICD-10-CM | POA: Diagnosis not present

## 2015-04-22 DIAGNOSIS — Z471 Aftercare following joint replacement surgery: Secondary | ICD-10-CM | POA: Diagnosis not present

## 2015-04-22 DIAGNOSIS — M6281 Muscle weakness (generalized): Secondary | ICD-10-CM | POA: Diagnosis not present

## 2015-04-22 DIAGNOSIS — Z96641 Presence of right artificial hip joint: Secondary | ICD-10-CM | POA: Diagnosis not present

## 2015-04-24 DIAGNOSIS — Z7982 Long term (current) use of aspirin: Secondary | ICD-10-CM | POA: Diagnosis not present

## 2015-04-24 DIAGNOSIS — Z79891 Long term (current) use of opiate analgesic: Secondary | ICD-10-CM | POA: Diagnosis not present

## 2015-04-24 DIAGNOSIS — Z471 Aftercare following joint replacement surgery: Secondary | ICD-10-CM | POA: Diagnosis not present

## 2015-04-24 DIAGNOSIS — M6281 Muscle weakness (generalized): Secondary | ICD-10-CM | POA: Diagnosis not present

## 2015-04-24 DIAGNOSIS — F329 Major depressive disorder, single episode, unspecified: Secondary | ICD-10-CM | POA: Diagnosis not present

## 2015-04-24 DIAGNOSIS — Z96641 Presence of right artificial hip joint: Secondary | ICD-10-CM | POA: Diagnosis not present

## 2015-04-27 DIAGNOSIS — M6281 Muscle weakness (generalized): Secondary | ICD-10-CM | POA: Diagnosis not present

## 2015-04-27 DIAGNOSIS — Z471 Aftercare following joint replacement surgery: Secondary | ICD-10-CM | POA: Diagnosis not present

## 2015-04-27 DIAGNOSIS — Z96641 Presence of right artificial hip joint: Secondary | ICD-10-CM | POA: Diagnosis not present

## 2015-04-27 DIAGNOSIS — F329 Major depressive disorder, single episode, unspecified: Secondary | ICD-10-CM | POA: Diagnosis not present

## 2015-04-27 DIAGNOSIS — Z79891 Long term (current) use of opiate analgesic: Secondary | ICD-10-CM | POA: Diagnosis not present

## 2015-04-27 DIAGNOSIS — Z7982 Long term (current) use of aspirin: Secondary | ICD-10-CM | POA: Diagnosis not present

## 2015-04-29 DIAGNOSIS — Z7982 Long term (current) use of aspirin: Secondary | ICD-10-CM | POA: Diagnosis not present

## 2015-04-29 DIAGNOSIS — Z96641 Presence of right artificial hip joint: Secondary | ICD-10-CM | POA: Diagnosis not present

## 2015-04-29 DIAGNOSIS — Z79891 Long term (current) use of opiate analgesic: Secondary | ICD-10-CM | POA: Diagnosis not present

## 2015-04-29 DIAGNOSIS — M6281 Muscle weakness (generalized): Secondary | ICD-10-CM | POA: Diagnosis not present

## 2015-04-29 DIAGNOSIS — F329 Major depressive disorder, single episode, unspecified: Secondary | ICD-10-CM | POA: Diagnosis not present

## 2015-04-29 DIAGNOSIS — Z471 Aftercare following joint replacement surgery: Secondary | ICD-10-CM | POA: Diagnosis not present

## 2015-04-30 DIAGNOSIS — Z471 Aftercare following joint replacement surgery: Secondary | ICD-10-CM | POA: Diagnosis not present

## 2015-04-30 DIAGNOSIS — F329 Major depressive disorder, single episode, unspecified: Secondary | ICD-10-CM | POA: Diagnosis not present

## 2015-04-30 DIAGNOSIS — Z79891 Long term (current) use of opiate analgesic: Secondary | ICD-10-CM | POA: Diagnosis not present

## 2015-04-30 DIAGNOSIS — Z96641 Presence of right artificial hip joint: Secondary | ICD-10-CM | POA: Diagnosis not present

## 2015-04-30 DIAGNOSIS — Z7982 Long term (current) use of aspirin: Secondary | ICD-10-CM | POA: Diagnosis not present

## 2015-04-30 DIAGNOSIS — M6281 Muscle weakness (generalized): Secondary | ICD-10-CM | POA: Diagnosis not present

## 2015-05-01 DIAGNOSIS — Z471 Aftercare following joint replacement surgery: Secondary | ICD-10-CM | POA: Diagnosis not present

## 2015-05-01 DIAGNOSIS — Z96641 Presence of right artificial hip joint: Secondary | ICD-10-CM | POA: Diagnosis not present

## 2015-05-06 DIAGNOSIS — Z96641 Presence of right artificial hip joint: Secondary | ICD-10-CM | POA: Insufficient documentation

## 2015-06-03 DIAGNOSIS — Z471 Aftercare following joint replacement surgery: Secondary | ICD-10-CM | POA: Diagnosis not present

## 2015-06-03 DIAGNOSIS — Z96641 Presence of right artificial hip joint: Secondary | ICD-10-CM | POA: Diagnosis not present

## 2015-08-31 ENCOUNTER — Encounter: Payer: Self-pay | Admitting: Family Medicine

## 2015-08-31 ENCOUNTER — Ambulatory Visit (INDEPENDENT_AMBULATORY_CARE_PROVIDER_SITE_OTHER): Payer: Medicare Other | Admitting: Family Medicine

## 2015-08-31 VITALS — BP 106/72 | HR 95 | Temp 98.7°F | Resp 14 | Wt 124.5 lb

## 2015-08-31 DIAGNOSIS — Z0000 Encounter for general adult medical examination without abnormal findings: Secondary | ICD-10-CM | POA: Diagnosis not present

## 2015-08-31 DIAGNOSIS — Z780 Asymptomatic menopausal state: Secondary | ICD-10-CM | POA: Insufficient documentation

## 2015-08-31 DIAGNOSIS — Z1239 Encounter for other screening for malignant neoplasm of breast: Secondary | ICD-10-CM | POA: Insufficient documentation

## 2015-08-31 MED ORDER — SERTRALINE HCL 50 MG PO TABS: 50.0000 mg | ORAL_TABLET | Freq: Every day | ORAL | Status: DC

## 2015-08-31 NOTE — Patient Instructions (Addendum)
Please do call to schedule your mammogram and bone density; the number to schedule one at either [facility] or [facility] is [phone] Increase the sertraline to 50 mg daily, and just call me in 3-4 weeks with an update I recommended 1,000 iu of vitamin D3 once a day  You will be due for a colonoscopy in 2019, not 2024  Health Maintenance, Female Adopting a healthy lifestyle and getting preventive care can go a [place] way to promote health and wellness. Talk with your health care provider about what schedule of regular examinations is right for you. This is a good chance for you to check in with your provider about disease prevention and staying healthy. In between checkups, there are plenty of things you can do on your own. Experts have done a lot of research about which lifestyle changes and preventive measures are most likely to keep you healthy. Ask your health care provider for more information. WEIGHT AND DIET  Eat a healthy diet  Be sure to include plenty of vegetables, fruits, low-fat dairy products, and lean protein.  Do not eat a lot of foods high in solid fats, added sugars, or salt.  Get regular exercise. This is one of the most important things you can do for your health.  Most adults should exercise for at least 150 minutes each week. The exercise should increase your heart rate and make you sweat (moderate-intensity exercise).  Most adults should also do strengthening exercises at least twice a week. This is in addition to the moderate-intensity exercise.  Maintain a healthy weight  Body mass index (BMI) is a measurement that can be used to identify possible weight problems. It estimates body fat based on height and weight. Your health care provider can help determine your BMI and help you achieve or maintain a healthy weight.  For females 85 years of age and older:   A BMI below 18.5 is considered underweight.  A BMI of 18.5 to 24.9 is  normal.  A BMI of 25 to 29.9 is considered overweight.  A BMI of 30 and above is considered obese.  Watch levels of cholesterol and blood lipids  You should start having your blood tested for lipids and cholesterol at 67 years of age, then have this test every 5 years.  You may need to have your cholesterol levels checked more often if:  Your lipid or cholesterol levels are high.  You are older than 67 years of age.  You are at high risk for heart disease.  CANCER SCREENING   Lung Cancer  Lung cancer screening is recommended for adults 3-54 years old who are at high risk for lung cancer because of a history of smoking.  A yearly low-dose CT scan of the lungs is recommended for people who:  Currently smoke.  Have quit within the past 15 years.  Have at least a 30-pack-year history of smoking. A pack year is smoking an average of one pack of cigarettes a day for 1 year.  Yearly screening should continue until it has been 15 years since you quit.  Yearly screening should stop if you develop a health problem that would prevent you from having lung cancer treatment.  Breast Cancer  Practice breast self-awareness. This means understanding how your breasts normally appear and feel.  It also means doing regular breast self-exams. Let your health care provider know about any changes, no matter how small.  If you are in your 60s or  44s, you should have a clinical breast exam (CBE) by a health care provider every 1-3 years as part of a regular health exam.  If you are 41 or older, have a CBE every year. Also consider having a breast X-ray (mammogram) every year.  If you have a family history of breast cancer, talk to your health care provider about genetic screening.  If you are at high risk for breast cancer, talk to your health care provider about having an MRI and a mammogram every year.  Breast cancer gene (BRCA) assessment is recommended for women who have family members  with BRCA-related cancers. BRCA-related cancers include:  Breast.  Ovarian.  Tubal.  Peritoneal cancers.  Results of the assessment will determine the need for genetic counseling and BRCA1 and BRCA2 testing. Cervical Cancer Your health care provider may recommend that you be screened regularly for cancer of the pelvic organs (ovaries, uterus, and vagina). This screening involves a pelvic examination, including checking for microscopic changes to the surface of your cervix (Pap test). You may be encouraged to have this screening done every 3 years, beginning at age 56.  For women ages 2-65, health care providers may recommend pelvic exams and Pap testing every 3 years, or they may recommend the Pap and pelvic exam, combined with testing for human papilloma virus (HPV), every 5 years. Some types of HPV increase your risk of cervical cancer. Testing for HPV may also be done on women of any age with unclear Pap test results.  Other health care providers may not recommend any screening for nonpregnant women who are considered low risk for pelvic cancer and who do not have symptoms. Ask your health care provider if a screening pelvic exam is right for you.  If you have had past treatment for cervical cancer or a condition that could lead to cancer, you need Pap tests and screening for cancer for at least 20 years after your treatment. If Pap tests have been discontinued, your risk factors (such as having a new sexual partner) need to be reassessed to determine if screening should resume. Some women have medical problems that increase the chance of getting cervical cancer. In these cases, your health care provider may recommend more frequent screening and Pap tests. Colorectal Cancer  This type of cancer can be detected and often prevented.  Routine colorectal cancer screening usually begins at 67 years of age and continues through 67 years of age.  Your health care provider may recommend  screening at an earlier age if you have risk factors for colon cancer.  Your health care provider may also recommend using home test kits to check for hidden blood in the stool.  A small camera at the end of a tube can be used to examine your colon directly (sigmoidoscopy or colonoscopy). This is done to check for the earliest forms of colorectal cancer.  Routine screening usually begins at age 52.  Direct examination of the colon should be repeated every 5-10 years through 67 years of age. However, you may need to be screened more often if early forms of precancerous polyps or small growths are found. Skin Cancer  Check your skin from head to toe regularly.  Tell your health care provider about any new moles or changes in moles, especially if there is a change in a mole's shape or color.  Also tell your health care provider if you have a mole that is larger than the size of a pencil eraser.  Always  use sunscreen. Apply sunscreen liberally and repeatedly throughout the day.  Protect yourself by wearing Beck sleeves, pants, a wide-brimmed hat, and sunglasses whenever you are outside. HEART DISEASE, DIABETES, AND HIGH BLOOD PRESSURE   High blood pressure causes heart disease and increases the risk of stroke. High blood pressure is more likely to develop in:  People who have blood pressure in the high end of the normal range (130-139/85-89 mm Hg).  People who are overweight or obese.  People who are African American.  If you are 45-69 years of age, have your blood pressure checked every 3-5 years. If you are 45 years of age or older, have your blood pressure checked every year. You should have your blood pressure measured twice--once when you are at a hospital or clinic, and once when you are not at a hospital or clinic. Record the average of the two measurements. To check your blood pressure when you are not at a hospital or clinic, you can use:  An automated blood pressure machine at a  pharmacy.  A home blood pressure monitor.  If you are between 78 years and 62 years old, ask your health care provider if you should take aspirin to prevent strokes.  Have regular diabetes screenings. This involves taking a blood sample to check your fasting blood sugar level.  If you are at a normal weight and have a low risk for diabetes, have this test once every three years after 67 years of age.  If you are overweight and have a high risk for diabetes, consider being tested at a younger age or more often. PREVENTING INFECTION  Hepatitis B  If you have a higher risk for hepatitis B, you should be screened for this virus. You are considered at high risk for hepatitis B if:  You were born in a country where hepatitis B is common. Ask your health care provider which countries are considered high risk.  Your parents were born in a high-risk country, and you have not been immunized against hepatitis B (hepatitis B vaccine).  You have HIV or AIDS.  You use needles to inject street drugs.  You live with someone who has hepatitis B.  You have had sex with someone who has hepatitis B.  You get hemodialysis treatment.  You take certain medicines for conditions, including cancer, organ transplantation, and autoimmune conditions. Hepatitis C  Blood testing is recommended for:  Everyone born from [birth year] through 1967.  Anyone with known risk factors for hepatitis C. Sexually transmitted infections (STIs)  You should be screened for sexually transmitted infections (STIs) including gonorrhea and chlamydia if:  You are sexually active and are younger than 67 years of age.  You are older than 67 years of age and your health care provider tells you that you are at risk for this type of infection.  Your sexual activity has changed since you were last screened and you are at an increased risk for chlamydia or gonorrhea. Ask your health care provider if you are at risk.  If you do not  have HIV, but are at risk, it may be recommended that you take a prescription medicine daily to prevent HIV infection. This is called pre-exposure prophylaxis (PrEP). You are considered at risk if:  You are sexually active and do not regularly use condoms or know the HIV status of your partner(s).  You take drugs by injection.  You are sexually active with a partner who has HIV. Talk with your health care  provider about whether you are at high risk of being infected with HIV. If you choose to begin PrEP, you should first be tested for HIV. You should then be tested every 3 months for as [place] as you are taking PrEP.  PREGNANCY   If you are premenopausal and you may become pregnant, ask your health care provider about preconception counseling.  If you may become pregnant, take 400 to 800 micrograms (mcg) of folic acid every day.  If you want to prevent pregnancy, talk to your health care provider about birth control (contraception). OSTEOPOROSIS AND MENOPAUSE   Osteoporosis is a disease in which the bones lose minerals and strength with aging. This can result in serious bone fractures. Your risk for osteoporosis can be identified using a bone density scan.  If you are 47 years of age or older, or if you are at risk for osteoporosis and fractures, ask your health care provider if you should be screened.  Ask your health care provider whether you should take a calcium or vitamin D supplement to lower your risk for osteoporosis.  Menopause may have certain physical symptoms and risks.  Hormone replacement therapy may reduce some of these symptoms and risks. Talk to your health care provider about whether hormone replacement therapy is right for you.  HOME CARE INSTRUCTIONS   Schedule regular health, dental, and eye exams.  Stay current with your immunizations.   Do not use any tobacco products including cigarettes, chewing tobacco, or electronic cigarettes.  If you are pregnant, do not  drink alcohol.  If you are breastfeeding, limit how much and how often you drink alcohol.  Limit alcohol intake to no more than 1 drink per day for nonpregnant women. One drink equals 12 ounces of beer, 5 ounces of wine, or 1 ounces of hard liquor.  Do not use street drugs.  Do not share needles.  Ask your health care provider for help if you need support or information about quitting drugs.  Tell your health care provider if you often feel depressed.  Tell your health care provider if you have ever been abused or do not feel safe at home.   This information is not intended to replace advice given to you by your health care provider. Make sure you discuss any questions you have with your health care provider.   Document Released: 08/16/2010 Document Revised: 02/21/2014 Document Reviewed: 01/02/2013 Elsevier Interactive Patient Education [employer].

## 2015-08-31 NOTE — Progress Notes (Signed)
Patient: Shannon Weber, Female    DOB: 11-25-1950, 65 y.o.   MRN: [id number]  Visit Date: 11/04/2015  Today's Provider: Enid Derry, MD   Chief Complaint  Patient presents with  . Annual Exam    medicare wellness    Subjective:   Shannon Weber is a 65 y.o. female who presents today for her Subsequent Annual Wellness Visit.  Caregiver input:  N/a  Interim hx: she did very well s/p hip replacement, anterior approach, went to [facility]; she had a bad experience with the levaquin, brought letter with her (see scanned letter)  USPSTF grade A and B recommendations Alcohol: rare Depression: Depression screen [facility] 2/9 08/31/2015 08/29/2014 08/01/2014  Decreased Interest 0 1 1  Down, Depressed, Hopeless 1 1 0  PHQ - 2 Score '1 2 1  ' she is taking half of a sertraline; still anxious, ruminating, wonders if needing to increase; no nausea  Hypertension: excellent Obesity: excellent Tobacco use: nonsmoker HIV, hep B, hep C: politely declined STD testing and prevention (chl/gon/syphilis): politely declined Lipids: excellent HDL; LDL under 130 Glucose: 94 in 2016 Colorectal cancer: 2014; 10 year pass Breast cancer: ordered mamm BRCA gene screening: no ovarian cancer Intimate partner violence: no Cervical cancer screening: no, graduated Lung cancer: n/a Osteoporosis: ordered DEXA Fall prevention/vitamin D: discussed, taking vit D AAA: n/a Aspirin: dfaily Diet: good eater Exercise: active Skin cancer: no worrisome moles, one removed  HPI  Review of Systems  Past Medical History:  Diagnosis Date  . Abnormal serum lipase level   . Chocolate cyst of ovary   . Fibroid   . Hypercholesteremia   . Inflammatory polyps    Gall bladder (2)  . 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 Onychomycosis    Past Surgical History:  Procedure Laterality Date  . ABDOMINAL HYSTERECTOMY    . BREAST BIOPSY Left 1980's   negative  . BREAST CYST ASPIRATION Right 2008   negative  .  COLONOSCOPY  2014  . COLONOSCOPY W/ POLYPECTOMY    . EXTRACORPOREAL SHOCK WAVE LITHOTRIPSY Left 12/25/2014   Procedure: EXTRACORPOREAL SHOCK WAVE LITHOTRIPSY (ESWL);  Surgeon: Royston Cowper, MD;  Location: [facility] ORS;  Service: Urology;  Laterality: Left;  . mole removed    . TOTAL HIP ARTHROPLASTY Right [id number]   Family History  Problem Relation Age of Onset  . Goiter Mother   . Cancer Father     colon  . Hyperlipidemia Father   . Heart disease Father 6  . Breast cancer Maternal Aunt   . Breast cancer Paternal Aunt    Social History   Social History  . Marital status: Married    Spouse name: N/A  . Number of children: N/A  . Years of education: N/A   Occupational History  . Not on file.   Social History Main Topics  . Smoking status: Never Smoker  . Smokeless tobacco: Never Used  . Alcohol use No  . Drug use: No  . Sexual activity: Not on file   Other Topics Concern  . Not on file   Social History Narrative  . No narrative on file   Outpatient Encounter Prescriptions as of 08/31/2015  Medication Sig Note  . aspirin EC 81 MG tablet Take by mouth. 08/31/2015: Received from: [facility]  . TURMERIC PO Take by mouth.   . ALOE VERA PO Take by mouth daily. Reported on 04/10/2015 04/10/2015: Not taking till surgery.   . Cholecalciferol (VITAMIN D PO)  Take by mouth daily. Reported on 04/10/2015 04/10/2015: Not taking till after surgery.   Marland Kitchen estradiol (ESTRACE) 0.1 MG/GM vaginal cream Place 1 Applicatorful vaginally 2 (two) times a week. (Patient not taking: Reported on 04/10/2015) 04/10/2015: Not taking till after surgery.   . Misc Natural Products (GLUCOSAMINE CHONDROITIN ADV PO) Take by mouth 2 (two) times daily. Reported on 04/10/2015 04/10/2015: Not taking till after surgery.   . Probiotic Product ([place]) Take by mouth daily. Reported on 04/10/2015 04/10/2015: Not taking till after surgery.   . pyridOXINE (VITAMIN B-6) 100 MG tablet Take 100  mg by mouth daily. Reported on 04/10/2015 04/10/2015: Not taking till after surgery.   . sertraline (ZOLOFT) 50 MG tablet Take 1 tablet (50 mg total) by mouth daily. STOP fluoxetine   . [DISCONTINUED] sertraline (ZOLOFT) 50 MG tablet Take 0.5 tablets (25 mg total) by mouth daily. STOP fluoxetine    No facility-administered encounter medications on file as of 08/31/2015.    Functional Ability / Safety Screening 1.  Was the timed Get Up and Go test longer than 30 seconds?  no 2.  Does the patient need help with the phone, transportation, shopping,      preparing meals, housework, laundry, medications, or managing money?  no 3.  Does the patient's home have:  loose throw rugs in the hallway?   no      Grab bars in the bathroom? yes, shower      Handrails on the stairs?   yes      Poor lighting?   no 4.  Has the patient noticed any hearing difficulties?   no  Fall Risk Assessment See under rooming  Depression Screen See under rooming Depression screen [facility] 2/9 08/31/2015 08/29/2014 08/01/2014  Decreased Interest 0 1 1  Down, Depressed, Hopeless 1 1 0  PHQ - 2 Score '1 2 1   ' Advanced Directives Does patient have a HCPOA?    yes If yes, name and contact information: husband, Tailey Top, [phone] Does patient have a living will or MOST form?  yes; see form  Objective:   Vitals: BP 106/72   Pulse 95   Temp 98.7 F (37.1 C) (Oral)   Resp 14   Wt 124 lb 8 oz (56.5 kg)   SpO2 98%   BMI 20.09 kg/m   Body mass index is 20.09 kg/m. No exam data present  Physical Exam Mood/affect:  euthymic Appearance:  Neatly, casually dressed  Cognitive Testing - 6-CIT  Correct? Score   What year is it? yes 0 Yes = 0    No = 4  What month is it? yes 0 Yes = 0    No = 3  Remember:     [address]ia [place], [place], [place]     What time is it? yes 0 Yes = 0    No = 3  Count backwards from 20 to 1 yes 0 Correct = 0    1 error = 2   More than 1 error = 4  Say the months of the year in reverse. yes 0  Correct = 0    1 error = 2   More than 1 error = 4  What address did I ask you to remember? yes 0 Correct = 0  1 error = 2    2 error = 4    3 error = 6    4 error = 8    All wrong = 10  TOTAL SCORE  0/28   Interpretation:  Normal  Normal (0-7) Abnormal (8-28)    Assessment & Plan:     Annual Wellness Visit  Reviewed patient's Family Medical History Reviewed and updated list of patient's medical providers Assessment of cognitive impairment was done Assessed patient's functional ability Established a written schedule for health screening [place] Completed and Reviewed  Exercise Activities and Dietary recommendations activity  Immunization History  Administered Date(s) Administered  . Influenza,inj,Quad PF,36+ Mos 01/05/2015  . Influenza-Unspecified 02/20/2014  . Pneumococcal Conjugate-13 04/07/2015  . Tdap 08/29/2014  . Zoster 12/16/2010    Health Maintenance  Topic Date Due  . DEXA SCAN  08/16/2013  . INFLUENZA VACCINE  09/15/2015  . Hepatitis C Screening  01/21/2016 (Originally 1950/05/27)  . PNA vac Low Risk Adult (2 of 2 - PPSV23) 04/06/2016  . MAMMOGRAM  07/22/2016  . COLONOSCOPY  11/09/2017  . TETANUS/TDAP  08/28/2024  . ZOSTAVAX  Completed   Discussed health benefits of physical activity, and encouraged her to engage in regular exercise appropriate for her age and condition.   Meds ordered this encounter  Medications  . aspirin EC 81 MG tablet    Sig: Take by mouth.  . TURMERIC PO    Sig: Take by mouth.  . sertraline (ZOLOFT) 50 MG tablet    Sig: Take 1 tablet (50 mg total) by mouth daily. STOP fluoxetine    Dispense:  30 tablet    Refill:  11    We're increasing the dose    Current Outpatient Prescriptions:  .  aspirin EC 81 MG tablet, Take by mouth., Disp: , Rfl:  .  TURMERIC PO, Take by mouth., Disp: , Rfl:  .  ALOE VERA PO, Take by mouth daily. Reported on 04/10/2015, Disp: , Rfl:  .  Cholecalciferol (VITAMIN D PO),  Take by mouth daily. Reported on 04/10/2015, Disp: , Rfl:  .  estradiol (ESTRACE) 0.1 MG/GM vaginal cream, Place 1 Applicatorful vaginally 2 (two) times a week. (Patient not taking: Reported on 04/10/2015), Disp: 42.5 g, Rfl: 11 .  Misc Natural Products (GLUCOSAMINE CHONDROITIN ADV PO), Take by mouth 2 (two) times daily. Reported on 04/10/2015, Disp: , Rfl:  .  Probiotic Product ([place]), Take by mouth daily. Reported on 04/10/2015, Disp: , Rfl:  .  pyridOXINE (VITAMIN B-6) 100 MG tablet, Take 100 mg by mouth daily. Reported on 04/10/2015, Disp: , Rfl:  .  sertraline (ZOLOFT) 50 MG tablet, Take 1 tablet (50 mg total) by mouth daily. STOP fluoxetine, Disp: 30 tablet, Rfl: 11 Medications Discontinued During This Encounter  Medication Reason  . sertraline (ZOLOFT) 50 MG tablet Reorder    Next Medicare Wellness Visit in 12+ months  Problem List Items Addressed This Visit      Other   Preventative health care - Primary    USPSTF grade A and B recommendations reviewed with patient; age-appropriate recommendations, preventive care, screening tests, etc discussed and encouraged; healthy living encouraged; see AVS for patient education given to patient      Postmenopausal status   Relevant Orders   DG Bone Density   Breast cancer screening   Relevant Orders   MM DIGITAL SCREENING BILATERAL    Other Visit Diagnoses   None.

## 2015-09-28 DIAGNOSIS — L853 Xerosis cutis: Secondary | ICD-10-CM | POA: Diagnosis not present

## 2015-09-28 DIAGNOSIS — L812 Freckles: Secondary | ICD-10-CM | POA: Diagnosis not present

## 2015-09-28 DIAGNOSIS — I781 Nevus, non-neoplastic: Secondary | ICD-10-CM | POA: Diagnosis not present

## 2015-09-28 DIAGNOSIS — D1800 Hemangioma unspecified site: Secondary | ICD-10-CM | POA: Diagnosis not present

## 2015-09-28 DIAGNOSIS — L578 Other skin changes due to chronic exposure to nonionizing radiation: Secondary | ICD-10-CM | POA: Diagnosis not present

## 2015-09-28 DIAGNOSIS — D229 Melanocytic nevi, unspecified: Secondary | ICD-10-CM | POA: Diagnosis not present

## 2015-09-28 DIAGNOSIS — L570 Actinic keratosis: Secondary | ICD-10-CM | POA: Diagnosis not present

## 2015-09-28 DIAGNOSIS — I8393 Asymptomatic varicose veins of bilateral lower extremities: Secondary | ICD-10-CM | POA: Diagnosis not present

## 2015-09-28 DIAGNOSIS — L821 Other seborrheic keratosis: Secondary | ICD-10-CM | POA: Diagnosis not present

## 2015-09-28 DIAGNOSIS — Z1283 Encounter for screening for malignant neoplasm of skin: Secondary | ICD-10-CM | POA: Diagnosis not present

## 2015-09-28 DIAGNOSIS — D485 Neoplasm of uncertain behavior of skin: Secondary | ICD-10-CM | POA: Diagnosis not present

## 2015-10-21 DIAGNOSIS — S90861A Insect bite (nonvenomous), right foot, initial encounter: Secondary | ICD-10-CM | POA: Diagnosis not present

## 2015-11-04 DIAGNOSIS — Z0000 Encounter for general adult medical examination without abnormal findings: Secondary | ICD-10-CM | POA: Insufficient documentation

## 2015-11-04 NOTE — Assessment & Plan Note (Signed)
USPSTF grade A and B recommendations reviewed with patient; age-appropriate recommendations, preventive care, screening tests, etc discussed and encouraged; healthy living encouraged; see AVS for patient education given to patient  

## 2015-12-14 ENCOUNTER — Telehealth: Payer: Self-pay | Admitting: Family Medicine

## 2015-12-14 NOTE — Telephone Encounter (Signed)
Pt called wanting to schedule an appt and I offered 12/31/15, first available and pt declined that appt and ask that we call her if there is a cancellation in the next few days. Pt has a concern about her Sertraline and is requesting a return call. Home # is [phone], or [phone]. Work # is [phone].

## 2015-12-14 NOTE — Telephone Encounter (Signed)
Called and talked with patient she does not think the sertraline is helping wants to see about uping dose or changing meds.  Patient will schedule an appt.

## 2015-12-31 ENCOUNTER — Encounter: Payer: Self-pay | Admitting: Family Medicine

## 2015-12-31 ENCOUNTER — Ambulatory Visit (INDEPENDENT_AMBULATORY_CARE_PROVIDER_SITE_OTHER): Payer: Medicare Other | Admitting: Family Medicine

## 2015-12-31 VITALS — BP 102/68 | HR 98 | Temp 98.4°F | Resp 14 | Wt 121.0 lb

## 2015-12-31 DIAGNOSIS — Z5181 Encounter for therapeutic drug level monitoring: Secondary | ICD-10-CM | POA: Diagnosis not present

## 2015-12-31 DIAGNOSIS — Z23 Encounter for immunization: Secondary | ICD-10-CM | POA: Diagnosis not present

## 2015-12-31 DIAGNOSIS — E7800 Pure hypercholesterolemia, unspecified: Secondary | ICD-10-CM | POA: Diagnosis not present

## 2015-12-31 DIAGNOSIS — F330 Major depressive disorder, recurrent, mild: Secondary | ICD-10-CM | POA: Diagnosis not present

## 2015-12-31 LAB — LIPID PANEL
CHOL/HDL RATIO: 2.8 ratio (ref <5.0)
Cholesterol: 224 mg/dL — ABNORMAL HIGH (ref <200)
HDL: 79 mg/dL (ref >50)
LDL CALC: 129 mg/dL — AB (ref <100)
TRIGLYCERIDES: 80 mg/dL (ref <150)
VLDL: 16 mg/dL (ref <30)

## 2015-12-31 LAB — COMPLETE METABOLIC PANEL WITH GFR
ALT: 12 U/L (ref 6–29)
AST: 20 U/L (ref 10–35)
Albumin: 4 g/dL (ref 3.6–5.1)
Alkaline Phosphatase: 50 U/L (ref 33–130)
BILIRUBIN TOTAL: 0.5 mg/dL (ref 0.2–1.2)
BUN: 19 mg/dL (ref 7–25)
CHLORIDE: 103 mmol/L (ref 98–110)
CO2: 28 mmol/L (ref 20–31)
Calcium: 9.2 mg/dL (ref 8.6–10.4)
Creat: 0.59 mg/dL (ref 0.50–0.99)
Glucose, Bld: 89 mg/dL (ref 65–99)
POTASSIUM: 4.3 mmol/L (ref 3.5–5.3)
Sodium: 139 mmol/L (ref 135–146)
Total Protein: 6.7 g/dL (ref 6.1–8.1)

## 2015-12-31 LAB — CBC WITH DIFFERENTIAL/PLATELET
BASOS PCT: 1 %
Basophils Absolute: 43 cells/uL (ref 0–200)
EOS ABS: 43 {cells}/uL (ref 15–500)
Eosinophils Relative: 1 %
HEMATOCRIT: 40.6 % (ref 35.0–45.0)
HEMOGLOBIN: 13.6 g/dL (ref 11.7–15.5)
LYMPHS ABS: 1591 {cells}/uL (ref 850–3900)
Lymphocytes Relative: 37 %
MCH: 30.4 pg (ref 27.0–33.0)
MCHC: 33.5 g/dL (ref 32.0–36.0)
MCV: 90.6 fL (ref 80.0–100.0)
MONO ABS: 430 {cells}/uL (ref 200–950)
MPV: 10.1 fL (ref 7.5–12.5)
Monocytes Relative: 10 %
Neutro Abs: 2193 cells/uL (ref 1500–7800)
Neutrophils Relative %: 51 %
Platelets: 244 10*3/uL (ref 140–400)
RBC: 4.48 MIL/uL (ref 3.80–5.10)
RDW: 13.3 % (ref 11.0–15.0)
WBC: 4.3 10*3/uL (ref 3.8–10.8)

## 2015-12-31 LAB — TSH: TSH: 0.6 mIU/L

## 2015-12-31 MED ORDER — SERTRALINE HCL 50 MG PO TABS: ORAL_TABLET | ORAL | 0 refills | Status: DC

## 2015-12-31 NOTE — Progress Notes (Signed)
BP 102/68   Pulse 98   Temp 98.4 F (36.9 C) (Oral)   Resp 14   Wt 121 lb (54.9 kg)   BMI 19.53 kg/m    Subjective:    Patient ID: Shannon Weber, female    DOB: 23-Feb-1950, 65 y.o.   MRN: [id number]  HPI: Shannon Weber is a 65 y.o. female  Chief Complaint  Patient presents with  . Follow-up    depression  . 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; no fever with this; she has been trying essential oil on it, melaleuca (tea tree oil)  She had a similar lesion on the right foot, went to urgent care; no drainage, no red streaks; treated with a antibiotic cream and it resolved it; painful  She is feeling more depression; she had been having some issues; last year of doing her job; it's frustrating to her that they have not hired her replacement; she is aggravated and that might be part of it; saw grandchildren and just couldn't stop crying; one weekend, she just cried; cyclical issue; I asked if it happens this time of year; taking sertraline at 50 mg; she is outside every day from 11:30 to 12:15; has a happy light; does eat meat; takes B12 sublingual ; vit D3, was taking some, but wasn't sure about what dose to take She had been on prozac and then switched to sertraline; she is sensitive to some meds  She has high cholesterol; not entirely fasting this morning, had oatmeal and craisins, but would like lipids checked if drawing blood  Depression screen [facility] 2/9 12/31/2015 08/31/2015 08/29/2014 08/01/2014  Decreased Interest 2 0 1 1  Down, Depressed, Hopeless 1 1 1  0  PHQ - 2 Score 3 1 2 1   Altered sleeping 3 - - -  Tired, decreased energy 3 - - -  Change in appetite 0 - - -  Feeling bad or failure about yourself  2 - - -  Trouble concentrating 3 - - -  Moving slowly or fidgety/restless 1 - - -  Suicidal thoughts 0 - - -  PHQ-9  Score 15 - - -  Difficult doing work/chores Somewhat difficult - - -   Relevant past medical, surgical, family and social history reviewed Past Medical History:  Diagnosis Date  . Abnormal serum lipase level   . Chocolate cyst of ovary   . Fibroid   . Hypercholesteremia   . Inflammatory polyps    Gall bladder (2)  . Major depressive disorder in remission   . Onychomycosis    Past Surgical History:  Procedure Laterality Date  . ABDOMINAL HYSTERECTOMY    . BREAST BIOPSY Left 1980's   negative  . BREAST CYST ASPIRATION Right 2008   negative  . COLONOSCOPY  2014  . COLONOSCOPY W/ POLYPECTOMY    . EXTRACORPOREAL SHOCK WAVE LITHOTRIPSY Left 12/25/2014   Procedure: EXTRACORPOREAL SHOCK WAVE LITHOTRIPSY (ESWL);  Surgeon: Royston Cowper, MD;  Location: [facility] ORS;  Service: Urology;  Laterality: Left;  . mole removed    . 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 TOTAL HIP ARTHROPLASTY Right [id number]   Family History  Problem Relation Age of Onset  . Goiter Mother   . Cancer Father     colon  .  Hyperlipidemia Father   . Heart disease Father 77  . Breast cancer Maternal Aunt   . Breast cancer Paternal Aunt    Social History  Substance Use Topics  . Smoking status: Never Smoker  . Smokeless tobacco: Never Used  . Alcohol use No   Interim medical history since last visit reviewed. Allergies and medications reviewed  Review of Systems Per HPI unless specifically indicated above     Objective:    BP 102/68   Pulse 98   Temp 98.4 F (36.9 C) (Oral)   Resp 14   Wt 121 lb (54.9 kg)   BMI 19.53 kg/m   Wt Readings from Last 3 Encounters:  12/31/15 121 lb (54.9 kg)  08/31/15 124 lb 8 oz (56.5 kg)  04/10/15 117 lb (53.1 kg)    Physical Exam  Constitutional: She appears well-developed and well-nourished. No distress.  Cardiovascular: Normal rate and regular rhythm.   Pulmonary/Chest: Effort normal and breath sounds normal.  Skin: She is not  diaphoretic.  Scab on the right forearm; no fluctuance; no red streaks proximally; no vesicular component; does not appear to have a foreign body  Psychiatric: Her mood appears not anxious. Her affect is not blunt. Her speech is not delayed. She is not slowed and not withdrawn. Cognition and memory are not impaired. She exhibits a depressed mood (very mildly depressed, briefly tearful; not despondent; good eye contact with examiner). She expresses no homicidal and no suicidal ideation. She is attentive.      Assessment & Plan:   Problem List Items Addressed This Visit      Other   Medication monitoring encounter    Check labs      Relevant Orders   CBC with Differential/Platelet (Completed)   COMPLETE METABOLIC PANEL WITH GFR (Completed)   Major depressive disorder, recurrent episode, mild (HCC) - Primary (Chronic)    Check labs to r/o hypothyroidism; increase SSRI to 75 mg daily x 8 days, then 100 mg daily; patient to call me please with an update in a few weeks; call sooner if any problems; she agrees      Relevant Medications   sertraline (ZOLOFT) 50 MG tablet   Other Relevant Orders   TSH (Completed)   High cholesterol (Chronic)    Check lipids today, breakfast was oatmeal and craisins      Relevant Orders   Lipid panel (Completed)    Other Visit Diagnoses    Needs flu shot       Relevant Orders   Flu vaccine HIGH DOSE PF (Fluzone High dose) (Completed)      Follow up plan: Return in about 4 weeks (around 01/28/2016) for follow-up.  An after-visit summary was printed and given to the patient at [place].  Please see the patient instructions which may contain other information and recommendations beyond what is mentioned above in the assessment and plan.  Meds ordered this encounter  Medications  . Cranberry 1000 MG CAPS    Sig: Take by mouth.  . sertraline (ZOLOFT) 50 MG tablet    Sig: One and one-half pills by mouth daily x 8 days, then two pills daily     Dispense:  56 tablet    Refill:  0    We're increasing the dose    Orders Placed This Encounter  Procedures  . Flu vaccine HIGH DOSE PF (Fluzone High dose)  . TSH  . CBC with Differential/Platelet  . Lipid panel  . COMPLETE METABOLIC PANEL WITH GFR  Face-to-face time with patient was more than 25 minutes, >50% time spent counseling and coordination of care

## 2015-12-31 NOTE — Assessment & Plan Note (Signed)
Check labs 

## 2015-12-31 NOTE — Patient Instructions (Addendum)
You received the flu shot today; it should protect you against the flu virus over the coming months; it will take about two weeks for antibodies to develop; do try to stay away from hospitals, nursing homes, and daycares during peak flu season; taking extra vitamin C daily during flu season may help you avoid getting sick Let's check labs today Increase the sertraline by going from 50 mg to 75 mg daily for 8 days, and then go to 100 mg daily Let me know of your progress in a few weeks Take 1,000 iu vitamin D3 once a day

## 2015-12-31 NOTE — Assessment & Plan Note (Addendum)
Check labs to r/o hypothyroidism; increase SSRI to 75 mg daily x 8 days, then 100 mg daily; patient to call me please with an update in a few weeks; call sooner if any problems; she agrees

## 2015-12-31 NOTE — Assessment & Plan Note (Signed)
Check lipids today, breakfast was oatmeal and craisins

## 2016-01-10 DIAGNOSIS — J0100 Acute maxillary sinusitis, unspecified: Secondary | ICD-10-CM | POA: Diagnosis not present

## 2016-01-28 ENCOUNTER — Ambulatory Visit: Payer: Medicare Other | Admitting: Family Medicine

## 2016-01-30 ENCOUNTER — Other Ambulatory Visit: Payer: Self-pay | Admitting: Family Medicine

## 2016-02-01 ENCOUNTER — Other Ambulatory Visit: Payer: Self-pay | Admitting: Family Medicine

## 2016-02-01 MED ORDER — SERTRALINE HCL 100 MG PO TABS: 100.0000 mg | ORAL_TABLET | Freq: Every day | ORAL | 3 refills | Status: DC

## 2016-02-01 NOTE — Progress Notes (Signed)
Increase dose, note to pt to contact me if not working well

## 2016-02-11 ENCOUNTER — Ambulatory Visit
Admission: RE | Admit: 2016-02-11 | Discharge: 2016-02-11 | Disposition: A | Discharge status: Home / Self Care | Payer: Medicare Other | Source: Ambulatory Visit | Attending: Family Medicine | Admitting: Family Medicine

## 2016-02-11 ENCOUNTER — Other Ambulatory Visit: Payer: Self-pay | Admitting: Family Medicine

## 2016-02-11 DIAGNOSIS — Z1231 Encounter for screening mammogram for malignant neoplasm of breast: Secondary | ICD-10-CM | POA: Insufficient documentation

## 2016-02-11 DIAGNOSIS — Z13820 Encounter for screening for osteoporosis: Secondary | ICD-10-CM | POA: Insufficient documentation

## 2016-02-11 DIAGNOSIS — Z780 Asymptomatic menopausal state: Secondary | ICD-10-CM | POA: Insufficient documentation

## 2016-02-12 ENCOUNTER — Other Ambulatory Visit: Payer: Self-pay | Admitting: Family Medicine

## 2016-02-12 DIAGNOSIS — R928 Other abnormal and inconclusive findings on diagnostic imaging of breast: Secondary | ICD-10-CM

## 2016-02-12 DIAGNOSIS — N6489 Other specified disorders of breast: Secondary | ICD-10-CM

## 2016-02-16 ENCOUNTER — Encounter: Payer: Self-pay | Admitting: Family Medicine

## 2016-02-16 ENCOUNTER — Ambulatory Visit (INDEPENDENT_AMBULATORY_CARE_PROVIDER_SITE_OTHER): Payer: Medicare Other | Admitting: Family Medicine

## 2016-02-16 VITALS — BP 104/68 | HR 91 | Temp 98.9°F | Resp 14 | Wt 121.4 lb

## 2016-02-16 DIAGNOSIS — Z1159 Encounter for screening for other viral diseases: Secondary | ICD-10-CM | POA: Diagnosis not present

## 2016-02-16 DIAGNOSIS — N952 Postmenopausal atrophic vaginitis: Secondary | ICD-10-CM

## 2016-02-16 DIAGNOSIS — R928 Other abnormal and inconclusive findings on diagnostic imaging of breast: Secondary | ICD-10-CM

## 2016-02-16 DIAGNOSIS — F330 Major depressive disorder, recurrent, mild: Secondary | ICD-10-CM | POA: Diagnosis not present

## 2016-02-16 LAB — HEPATITIS C ANTIBODY: HCV AB: NEGATIVE

## 2016-02-16 NOTE — Assessment & Plan Note (Signed)
Discussed one-time hep C screening recommendation for individuals born between 1945-1965 per USPSTF guidelines; patient agrees with testing; Hep C Ab ordered 

## 2016-02-16 NOTE — Patient Instructions (Signed)
Do mental exercises Try to get five servings of fruits and vegetables daily Continue current medicines

## 2016-02-16 NOTE — Progress Notes (Signed)
BP 104/68   Pulse 91   Temp 98.9 F (37.2 C) (Oral)   Resp 14   Wt 121 lb 6.4 oz (55.1 kg)   SpO2 97%   BMI 19.59 kg/m    Subjective:    Patient ID: Shannon Weber, female    DOB: 01/02/1951, 66 y.o.   MRN: [id number]  HPI: Shannon Weber is a 66 y.o. female  Chief Complaint  Patient presents with  . Follow-up   She is here for f/u She had an abnormal mammogram; asymmetry of the right, due for more pictures One aunt from each side has had breast cancer Hx of breast biopsy on the LEFT; has had aspirations on the right Nothing palpable  She needs prescription for estrace, she uses just a little bit 1 or 1.5 tubes a year; atrophic vaginitis  She is on higher dose of sertraline; she is doing "much better"; sleeping much better; wakes up sometimes at 4 am and ruminates; thinks about things; can turn off the tape and go back to sleep better; overall more at peace; no manic or hypomanic symptoms with this change; no delusions  She has read that people on antidepressants are more likely to have memory issues; she worries about her memory; maternal grandfather had some form of dementia, but he had a terrible diet and had hardening of the arteries; patient's mother is 79 and forgets things but independent, takes no medicine; her father died of heart trouble in his 90s; had faculties; trouble recalling people's names, won't come immediately, but comes later; she declined offer for neurologic cognitive function testing (open invitation)  Depression screen [facility] 2/9 02/16/2016 12/31/2015 08/31/2015 08/29/2014 08/01/2014  Decreased Interest 0 2 0 1 1  Down, Depressed, Hopeless 0 1 1 1  0  PHQ - 2 Score 0 3 1 2 1   Altered sleeping - 3 - - -  Tired, decreased energy - 3 - - -  Change in appetite - 0 - - -  Feeling bad or failure about yourself  - 2 - - -  Trouble concentrating - 3 - - -  Moving slowly or fidgety/restless - 1 - - -  Suicidal thoughts - 0 - - -  PHQ-9 Score - 15 - - -  Difficult  doing work/chores - Somewhat difficult - - -    No flowsheet data found.  Relevant past medical, surgical, family and social history reviewed Past Medical History:  Diagnosis Date  . Abnormal serum lipase level   . Chocolate cyst of ovary   . Fibroid   . Hypercholesteremia   . Inflammatory polyps    Gall bladder (2)  . Major depressive disorder in remission   . Onychomycosis    Past Surgical History:  Procedure Laterality Date  . ABDOMINAL HYSTERECTOMY    . BREAST BIOPSY Left 1980's   negative  . BREAST CYST ASPIRATION Right 2008   negative  . COLONOSCOPY  2014  . COLONOSCOPY W/ POLYPECTOMY    . EXTRACORPOREAL SHOCK WAVE LITHOTRIPSY Left 12/25/2014   Procedure: EXTRACORPOREAL SHOCK WAVE LITHOTRIPSY (ESWL);  Surgeon: Royston Cowper, MD;  Location: [facility] ORS;  Service: Urology;  Laterality: Left;  . mole removed    . 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 TOTAL HIP ARTHROPLASTY Right [id number]   Family History  Problem Relation Age of Onset  . Goiter Mother   . Cancer Father     colon  . Hyperlipidemia Father   .  Heart disease Father 74  . Breast cancer Maternal Aunt   . Breast cancer Paternal Aunt    Social History  Substance Use Topics  . Smoking status: Never Smoker  . Smokeless tobacco: Never Used  . Alcohol use No   Interim medical history since last visit reviewed. Allergies and medications reviewed  Review of Systems Per HPI unless specifically indicated above     Objective:    BP 104/68   Pulse 91   Temp 98.9 F (37.2 C) (Oral)   Resp 14   Wt 121 lb 6.4 oz (55.1 kg)   SpO2 97%   BMI 19.59 kg/m   Wt Readings from Last 3 Encounters:  02/16/16 121 lb 6.4 oz (55.1 kg)  12/31/15 121 lb (54.9 kg)  08/31/15 124 lb 8 oz (56.5 kg)    Physical Exam  Constitutional: She appears well-developed and well-nourished. No distress.  Cardiovascular: Normal rate and regular rhythm.   Pulmonary/Chest: Effort normal and breath  sounds normal.  Neurological: She is alert. She displays no tremor.  Skin: She is not diaphoretic.  Psychiatric: Her mood appears not anxious. Her affect is not blunt. Her speech is not delayed. She is not slowed and not withdrawn. Cognition and memory are not impaired. She does not exhibit a depressed mood (not despondent; good eye contact with examiner). She expresses no homicidal and no suicidal ideation. She is attentive.   Results for orders placed or performed in visit on 02/16/16  Hepatitis C Antibody  Result Value Ref Range   HCV Ab NEGATIVE NEGATIVE      Assessment & Plan:   Problem List Items Addressed This Visit      Genitourinary   Atrophic vaginitis    Will have her start back on estrogen cream after breast imaging is completed        Other   Major depressive disorder, recurrent episode, mild (HCC) - Primary (Chronic)    Improved on medicine; continue same; no mania or hypomania with dose incresae      Encounter for hepatitis C screening test for low risk patient    Discussed one-time hep C screening recommendation for individuals born between 1945-1965 per USPSTF guidelines; patient agrees with testing; Hep C Ab ordered      Relevant Orders   Hepatitis C Antibody (Completed)   Abnormality of right breast on screening mammography    Due for additional pictures; will hold on the estrogen cream until work-up complete          Follow up plan: Return in about 6 months (around 08/15/2016) for follow-up.  An after-visit summary was printed and given to the patient at [place].  Please see the patient instructions which may contain other information and recommendations beyond what is mentioned above in the assessment and plan.  No orders of the defined types were placed in this encounter.   Orders Placed This Encounter  Procedures  . Hepatitis C Antibody

## 2016-02-21 ENCOUNTER — Encounter: Payer: Self-pay | Admitting: Family Medicine

## 2016-02-21 DIAGNOSIS — N952 Postmenopausal atrophic vaginitis: Secondary | ICD-10-CM

## 2016-02-21 DIAGNOSIS — R928 Other abnormal and inconclusive findings on diagnostic imaging of breast: Secondary | ICD-10-CM | POA: Insufficient documentation

## 2016-02-21 HISTORY — DX: Postmenopausal atrophic vaginitis: N95.2

## 2016-02-21 NOTE — Assessment & Plan Note (Signed)
Improved on medicine; continue same; no mania or hypomania with dose incresae

## 2016-02-21 NOTE — Assessment & Plan Note (Signed)
Due for additional pictures; will hold on the estrogen cream until work-up complete

## 2016-02-21 NOTE — Assessment & Plan Note (Signed)
Will have her start back on estrogen cream after breast imaging is completed

## 2016-02-24 ENCOUNTER — Ambulatory Visit
Admission: RE | Admit: 2016-02-24 | Discharge: 2016-02-24 | Disposition: A | Discharge status: Home / Self Care | Payer: Medicare Other | Source: Ambulatory Visit | Attending: Family Medicine | Admitting: Family Medicine

## 2016-02-24 DIAGNOSIS — N6489 Other specified disorders of breast: Secondary | ICD-10-CM | POA: Insufficient documentation

## 2016-02-24 DIAGNOSIS — R922 Inconclusive mammogram: Secondary | ICD-10-CM | POA: Diagnosis not present

## 2016-02-24 DIAGNOSIS — R928 Other abnormal and inconclusive findings on diagnostic imaging of breast: Secondary | ICD-10-CM

## 2016-02-24 DIAGNOSIS — N6001 Solitary cyst of right breast: Secondary | ICD-10-CM | POA: Diagnosis not present

## 2016-02-28 ENCOUNTER — Encounter: Payer: Self-pay | Admitting: Family Medicine

## 2016-03-02 MED ORDER — ESTRADIOL 0.1 MG/GM VA CREA: 1.0000 | TOPICAL_CREAM | VAGINAL | 11 refills | Status: DC

## 2016-03-29 DIAGNOSIS — Z09 Encounter for follow-up examination after completed treatment for conditions other than malignant neoplasm: Secondary | ICD-10-CM | POA: Insufficient documentation

## 2016-03-31 DIAGNOSIS — Z96641 Presence of right artificial hip joint: Secondary | ICD-10-CM | POA: Diagnosis not present

## 2016-03-31 DIAGNOSIS — Z09 Encounter for follow-up examination after completed treatment for conditions other than malignant neoplasm: Secondary | ICD-10-CM | POA: Diagnosis not present

## 2016-04-06 ENCOUNTER — Telehealth: Payer: Self-pay | Admitting: Family Medicine

## 2016-04-06 NOTE — Telephone Encounter (Signed)
I spoke with patient Shannon Weber has not tried anything else I'll see what it on formulary, try that If not effective, call us back Shannon Weber agrees --------------------------- So I tried to navigate [facility]'s web site and couldn't navigate to what I needed Roselyn Reef, if you don't mind, please find out what vaginal cream IS on their formulary so we can switch her to that Thank you for doing this detective work

## 2016-04-07 NOTE — Telephone Encounter (Signed)
Calling ins. To see what is approved.  Left voicemail they will call back, also notified patient.

## 2016-04-11 ENCOUNTER — Other Ambulatory Visit: Payer: Self-pay

## 2016-04-11 MED ORDER — SERTRALINE HCL 100 MG PO TABS: 100.0000 mg | ORAL_TABLET | Freq: Every day | ORAL | 9 refills | Status: DC

## 2016-04-11 NOTE — Telephone Encounter (Signed)
Sertraline  rx sent

## 2016-04-11 NOTE — Telephone Encounter (Signed)
Pt calling due to an issue with trying to get certain stuff covered.  Pt stated she no longer with [facility] and has changed to [facility]. Pt needs a refill on Zoloft. She also stated she will need a refill for Estrace in the near future. I mention to pt that she can call [facility] off [address] and asked the pharmacist to transfer her refills for Estrace to [facility] so she wouldn't have to call us.

## 2016-08-02 ENCOUNTER — Encounter: Payer: Self-pay | Admitting: Family Medicine

## 2016-08-03 MED ORDER — SERTRALINE HCL 100 MG PO TABS: 150.0000 mg | ORAL_TABLET | Freq: Every day | ORAL | 0 refills | Status: DC

## 2016-08-08 ENCOUNTER — Other Ambulatory Visit: Payer: Self-pay | Admitting: Family Medicine

## 2016-08-08 DIAGNOSIS — Z9189 Other specified personal risk factors, not elsewhere classified: Secondary | ICD-10-CM | POA: Insufficient documentation

## 2016-08-08 NOTE — Progress Notes (Signed)
Patient has been experiencing fatigue, confusion and is concerned that these could be possible symptoms of low sodium which is a known side effect of SSRI use. Advised that it is unlikely to be the case however will check serum sodium levels. Patient will come in for nurse visit for lab draw

## 2016-08-09 ENCOUNTER — Other Ambulatory Visit: Payer: Self-pay

## 2016-08-09 DIAGNOSIS — Z9189 Other specified personal risk factors, not elsewhere classified: Secondary | ICD-10-CM | POA: Diagnosis not present

## 2016-08-10 LAB — SODIUM: SODIUM: 139 mmol/L (ref 135–146)

## 2016-08-22 ENCOUNTER — Ambulatory Visit: Payer: Medicare Other | Admitting: Family Medicine

## 2016-09-01 ENCOUNTER — Ambulatory Visit (INDEPENDENT_AMBULATORY_CARE_PROVIDER_SITE_OTHER): Payer: Medicare Other | Admitting: Family Medicine

## 2016-09-01 ENCOUNTER — Encounter: Payer: Self-pay | Admitting: Family Medicine

## 2016-09-01 VITALS — BP 118/64 | HR 100 | Temp 98.5°F | Resp 14 | Ht 66.0 in | Wt 122.9 lb

## 2016-09-01 DIAGNOSIS — Z23 Encounter for immunization: Secondary | ICD-10-CM

## 2016-09-01 DIAGNOSIS — Z0000 Encounter for general adult medical examination without abnormal findings: Secondary | ICD-10-CM | POA: Diagnosis not present

## 2016-09-01 DIAGNOSIS — L309 Dermatitis, unspecified: Secondary | ICD-10-CM | POA: Diagnosis not present

## 2016-09-01 DIAGNOSIS — W57XXXA Bitten or stung by nonvenomous insect and other nonvenomous arthropods, initial encounter: Secondary | ICD-10-CM

## 2016-09-01 NOTE — Patient Instructions (Addendum)
Something like calamine lotion and/or claritin or benadryl may help the itching bumps Consider having an exterminator come out to check for pests   Health Maintenance  Topic Date Due  . PNA vac Low Risk Adult (2 of 2 - PPSV23) 04/06/2016  . INFLUENZA VACCINE  09/14/2016  . MAMMOGRAM  02/10/2017  . COLONOSCOPY  11/09/2017  . TETANUS/TDAP  08/28/2024  . DEXA SCAN  Completed  . Hepatitis C Screening  Completed    Next bone density will be due on or after Feb 11, 2018 Please consider the new shingles vaccine called Shingrix and you can get that at a participating pharmacy I'll suggest 1,000 iu of vitamin D3 daily  You have received the Pneumovax vaccine (PPSV-23) and you will not need another booster of this for the rest of your life per current ACIP guidelines  Health Maintenance, Female Adopting a healthy lifestyle and getting preventive care can go a [place] way to promote health and wellness. Talk with your health care provider about what schedule of regular examinations is right for you. This is a good chance for you to check in with your provider about disease prevention and staying healthy. In between checkups, there are plenty of things you can do on your own. Experts have done a lot of research about which lifestyle changes and preventive measures are most likely to keep you healthy. Ask your health care provider for more information. Weight and diet Eat a healthy diet  Be sure to include plenty of vegetables, fruits, low-fat dairy products, and lean protein.  Do not eat a lot of foods high in solid fats, added sugars, or salt.  Get regular exercise. This is one of the most important things you can do for your health. ? Most adults should exercise for at least 150 minutes each week. The exercise should increase your heart rate and make you sweat (moderate-intensity exercise). ? Most adults should also do strengthening exercises at least twice a week. This is in addition to the  moderate-intensity exercise.  Maintain a healthy weight  Body mass index (BMI) is a measurement that can be used to identify possible weight problems. It estimates body fat based on height and weight. Your health care provider can help determine your BMI and help you achieve or maintain a healthy weight.  For females 57 years of age and older: ? A BMI below 18.5 is considered underweight. ? A BMI of 18.5 to 24.9 is normal. ? A BMI of 25 to 29.9 is considered overweight. ? A BMI of 30 and above is considered obese.  Watch levels of cholesterol and blood lipids  You should start having your blood tested for lipids and cholesterol at 68 years of age, then have this test every 5 years.  You may need to have your cholesterol levels checked more often if: ? Your lipid or cholesterol levels are high. ? You are older than 68 years of age. ? You are at high risk for heart disease.  Cancer screening Lung Cancer  Lung cancer screening is recommended for adults 43-28 years old who are at high risk for lung cancer because of a history of smoking.  A yearly low-dose CT scan of the lungs is recommended for people who: ? Currently smoke. ? Have quit within the past 15 years. ? Have at least a 30-pack-year history of smoking. A pack year is smoking an average of one pack of cigarettes a day for 1 year.  Yearly screening should continue  until it has been 15 years since you quit.  Yearly screening should stop if you develop a health problem that would prevent you from having lung cancer treatment.  Breast Cancer  Practice breast self-awareness. This means understanding how your breasts normally appear and feel.  It also means doing regular breast self-exams. Let your health care provider know about any changes, no matter how small.  If you are in your 20s or 30s, you should have a clinical breast exam (CBE) by a health care provider every 1-3 years as part of a regular health exam.  If you  are 6 or older, have a CBE every year. Also consider having a breast X-ray (mammogram) every year.  If you have a family history of breast cancer, talk to your health care provider about genetic screening.  If you are at high risk for breast cancer, talk to your health care provider about having an MRI and a mammogram every year.  Breast cancer gene (BRCA) assessment is recommended for women who have family members with BRCA-related cancers. BRCA-related cancers include: ? Breast. ? Ovarian. ? Tubal. ? Peritoneal cancers.  Results of the assessment will determine the need for genetic counseling and BRCA1 and BRCA2 testing.  Cervical Cancer Your health care provider may recommend that you be screened regularly for cancer of the pelvic organs (ovaries, uterus, and vagina). This screening involves a pelvic examination, including checking for microscopic changes to the surface of your cervix (Pap test). You may be encouraged to have this screening done every 3 years, beginning at age 71.  For women ages 64-65, health care providers may recommend pelvic exams and Pap testing every 3 years, or they may recommend the Pap and pelvic exam, combined with testing for human papilloma virus (HPV), every 5 years. Some types of HPV increase your risk of cervical cancer. Testing for HPV may also be done on women of any age with unclear Pap test results.  Other health care providers may not recommend any screening for nonpregnant women who are considered low risk for pelvic cancer and who do not have symptoms. Ask your health care provider if a screening pelvic exam is right for you.  If you have had past treatment for cervical cancer or a condition that could lead to cancer, you need Pap tests and screening for cancer for at least 20 years after your treatment. If Pap tests have been discontinued, your risk factors (such as having a new sexual partner) need to be reassessed to determine if screening should  resume. Some women have medical problems that increase the chance of getting cervical cancer. In these cases, your health care provider may recommend more frequent screening and Pap tests.  Colorectal Cancer  This type of cancer can be detected and often prevented.  Routine colorectal cancer screening usually begins at 68 years of age and continues through 68 years of age.  Your health care provider may recommend screening at an earlier age if you have risk factors for colon cancer.  Your health care provider may also recommend using home test kits to check for hidden blood in the stool.  A small camera at the end of a tube can be used to examine your colon directly (sigmoidoscopy or colonoscopy). This is done to check for the earliest forms of colorectal cancer.  Routine screening usually begins at age 4.  Direct examination of the colon should be repeated every 5-10 years through 68 years of age. However, you may need  to be screened more often if early forms of precancerous polyps or small growths are found.  Skin Cancer  Check your skin from head to toe regularly.  Tell your health care provider about any new moles or changes in moles, especially if there is a change in a mole's shape or color.  Also tell your health care provider if you have a mole that is larger than the size of a pencil eraser.  Always use sunscreen. Apply sunscreen liberally and repeatedly throughout the day.  Protect yourself by wearing Robar sleeves, pants, a wide-brimmed hat, and sunglasses whenever you are outside.  Heart disease, diabetes, and high blood pressure  High blood pressure causes heart disease and increases the risk of stroke. High blood pressure is more likely to develop in: ? People who have blood pressure in the high end of the normal range (130-139/85-89 mm Hg). ? People who are overweight or obese. ? People who are African American.  If you are 55-97 years of age, have your blood  pressure checked every 3-5 years. If you are 70 years of age or older, have your blood pressure checked every year. You should have your blood pressure measured twice-once when you are at a hospital or clinic, and once when you are not at a hospital or clinic. Record the average of the two measurements. To check your blood pressure when you are not at a hospital or clinic, you can use: ? An automated blood pressure machine at a pharmacy. ? A home blood pressure monitor.  If you are between 58 years and 12 years old, ask your health care provider if you should take aspirin to prevent strokes.  Have regular diabetes screenings. This involves taking a blood sample to check your fasting blood sugar level. ? If you are at a normal weight and have a low risk for diabetes, have this test once every three years after 68 years of age. ? If you are overweight and have a high risk for diabetes, consider being tested at a younger age or more often. Preventing infection Hepatitis B  If you have a higher risk for hepatitis B, you should be screened for this virus. You are considered at high risk for hepatitis B if: ? You were born in a country where hepatitis B is common. Ask your health care provider which countries are considered high risk. ? Your parents were born in a high-risk country, and you have not been immunized against hepatitis B (hepatitis B vaccine). ? You have HIV or AIDS. ? You use needles to inject street drugs. ? You live with someone who has hepatitis B. ? You have had sex with someone who has hepatitis B. ? You get hemodialysis treatment. ? You take certain medicines for conditions, including cancer, organ transplantation, and autoimmune conditions.  Hepatitis C  Blood testing is recommended for: ? Everyone born from [birth year] through 1967. ? Anyone with known risk factors for hepatitis C.  Sexually transmitted infections (STIs)  You should be screened for sexually transmitted  infections (STIs) including gonorrhea and chlamydia if: ? You are sexually active and are younger than 68 years of age. ? You are older than 68 years of age and your health care provider tells you that you are at risk for this type of infection. ? Your sexual activity has changed since you were last screened and you are at an increased risk for chlamydia or gonorrhea. Ask your health care provider if you are at risk.  If you do not have HIV, but are at risk, it may be recommended that you take a prescription medicine daily to prevent HIV infection. This is called pre-exposure prophylaxis (PrEP). You are considered at risk if: ? You are sexually active and do not regularly use condoms or know the HIV status of your partner(s). ? You take drugs by injection. ? You are sexually active with a partner who has HIV.  Talk with your health care provider about whether you are at high risk of being infected with HIV. If you choose to begin PrEP, you should first be tested for HIV. You should then be tested every 3 months for as [place] as you are taking PrEP. Pregnancy  If you are premenopausal and you may become pregnant, ask your health care provider about preconception counseling.  If you may become pregnant, take 400 to 800 micrograms (mcg) of folic acid every day.  If you want to prevent pregnancy, talk to your health care provider about birth control (contraception). Osteoporosis and menopause  Osteoporosis is a disease in which the bones lose minerals and strength with aging. This can result in serious bone fractures. Your risk for osteoporosis can be identified using a bone density scan.  If you are 29 years of age or older, or if you are at risk for osteoporosis and fractures, ask your health care provider if you should be screened.  Ask your health care provider whether you should take a calcium or vitamin D supplement to lower your risk for osteoporosis.  Menopause may have certain physical  symptoms and risks.  Hormone replacement therapy may reduce some of these symptoms and risks. Talk to your health care provider about whether hormone replacement therapy is right for you. Follow these instructions at home:  Schedule regular health, dental, and eye exams.  Stay current with your immunizations.  Do not use any tobacco products including cigarettes, chewing tobacco, or electronic cigarettes.  If you are pregnant, do not drink alcohol.  If you are breastfeeding, limit how much and how often you drink alcohol.  Limit alcohol intake to no more than 1 drink per day for nonpregnant women. One drink equals 12 ounces of beer, 5 ounces of wine, or 1 ounces of hard liquor.  Do not use street drugs.  Do not share needles.  Ask your health care provider for help if you need support or information about quitting drugs.  Tell your health care provider if you often feel depressed.  Tell your health care provider if you have ever been abused or do not feel safe at home. This information is not intended to replace advice given to you by your health care provider. Make sure you discuss any questions you have with your health care provider. Document Released: 08/16/2010 Document Revised: 07/09/2015 Document Reviewed: 11/04/2014 [employer] Interactive Patient Education  [employer].

## 2016-09-01 NOTE — Progress Notes (Addendum)
Patient: Shannon Weber, Female    DOB: 1950-11-12, 66 y.o.   MRN: [id number]  Visit Date: 09/28/2016  Today's Provider: Enid Derry, MD   Chief Complaint  Patient presents with  . Medicare Wellness    Subjective:   Shannon Weber is a 66 y.o. female who presents today for her Subsequent Annual Wellness Visit.  Caregiver input:  N/a --------------------------------------------------------- She has plenty of bites or bumps; she thinks she has either a tick disease or thinks she has bedbugs or being bitten by something when she waters the flowers Her mother is moving her and she is redoing a house Those are on the legs and a few on the back Husband not having any bites Does have a dog and is not scratcing, but does chew on his paws She tried cortisone but thought she had an allergic reaction ---------------------------------------------------------- USPSTF grade A and B recommendations Depression:  Depression screen [facility] 2/9 09/01/2016 02/16/2016 12/31/2015 08/31/2015 08/29/2014  Decreased Interest 0 0 2 0 1  Down, Depressed, Hopeless 1 0 1 1 1   PHQ - 2 Score 1 0 3 1 2   Altered sleeping - - 3 - -  Tired, decreased energy - - 3 - -  Change in appetite - - 0 - -  Feeling bad or failure about yourself  - - 2 - -  Trouble concentrating - - 3 - -  Moving slowly or fidgety/restless - - 1 - -  Suicidal thoughts - - 0 - -  PHQ-9 Score - - 15 - -  Difficult doing work/chores - - Somewhat difficult - -   Hypertension: BP Readings from Last 3 Encounters:  09/01/16 118/64  02/16/16 104/68  12/31/15 102/68   Obesity: Wt Readings from Last 3 Encounters:  09/01/16 122 lb 14.4 oz (55.7 kg)  02/16/16 121 lb 6.4 oz (55.1 kg)  12/31/15 121 lb (54.9 kg)   BMI Readings from Last 3 Encounters:  09/01/16 19.84 kg/m  02/16/16 19.59 kg/m  12/31/15 19.53 kg/m    6CIT Screen 09/01/2016  What Year? 0 points  What month? 0 points  What time? 0 points  Count back from 20 0 points  Months in  reverse 0 points  Repeat phrase 0 points  Total Score 0   Alcohol: no Tobacco use: never HIV, hep B, hep C: declined STD testing and prevention (chl/gon/syphilis): declined Intimate partner violence: no abuse Breast cancer: mammo UTD Cervical cancer screening: graduated Osteoporosis: DEXA done 2017 and normal; next on or after Feb 11, 2018 Fall prevention/vitamin D: taking vit D3 one a day Lipids:  Lab Results  Component Value Date   CHOL 224 (H) 12/31/2015   CHOL 208 (H) 08/01/2014   Lab Results  Component Value Date   HDL 79 12/31/2015   HDL 66 08/01/2014   Lab Results  Component Value Date   LDLCALC 129 (H) 12/31/2015   LDLCALC 126 (H) 08/01/2014   Lab Results  Component Value Date   TRIG 80 12/31/2015   TRIG 82 08/01/2014   Lab Results  Component Value Date   CHOLHDL 2.8 12/31/2015   No results found for: LDLDIRECT Glucose:  Glucose  Date Value Ref Range Status  08/01/2014 94 65 - 99 mg/dL Final   Glucose, Bld  Date Value Ref Range Status  12/31/2015 89 65 - 99 mg/dL Final   Colorectal cancer: 2014; next due 2019 Lung cancer:  Never smoker AAA: n/a, no fam hx Aspirin: taking 81 mg coated daily Diet: discussed,  has yogurt and cheese; husband has type 1 diabetes mellitus and they eat well Exercise: some activity; retiring this month, she goes up and down several times a day, does some lifting; does not go to gym Skin cancer: has been to the dermatologist and had a spot removed, screening all over  Immunizations: checking on ppsv-23, discussed shingrix; suggested flu vaccines yearly  HPI  Review of Systems  Past Medical History:  Diagnosis Date  . Abnormal serum lipase level   . 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 Chocolate cyst of ovary   . Fibroid   . Hypercholesteremia   . Inflammatory polyps    Gall bladder (2)  . 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 Onychomycosis     Past Surgical History:  Procedure Laterality Date  .  ABDOMINAL HYSTERECTOMY    . BREAST BIOPSY Left 1980's   negative  . BREAST CYST ASPIRATION Right 2008   negative  . COLONOSCOPY  2014  . COLONOSCOPY W/ POLYPECTOMY    . EXTRACORPOREAL SHOCK WAVE LITHOTRIPSY Left 12/25/2014   Procedure: EXTRACORPOREAL SHOCK WAVE LITHOTRIPSY (ESWL);  Surgeon: Royston Cowper, MD;  Location: [facility] ORS;  Service: Urology;  Laterality: Left;  . mole removed    . 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 TOTAL HIP ARTHROPLASTY Right [id number]   Family History  Problem Relation Age of Onset  . Goiter Mother   . Cancer Father        colon/lung  . Hyperlipidemia Father   . Heart disease Father 5  . Breast cancer Maternal Aunt   . Breast cancer Paternal Aunt   . Bipolar disorder Brother   . Alzheimer's disease Maternal Grandfather   . Heart disease Paternal Grandmother    Social History   Social History  . Marital status: Married    Spouse name: N/A  . Number of children: N/A  . Years of education: N/A   Occupational History  . Not on file.   Social History Main Topics  . Smoking status: Never Smoker  . Smokeless tobacco: Never Used  . Alcohol use No  . Drug use: No  . Sexual activity: Yes   Other Topics Concern  . Not on file   Social History Narrative  . No narrative on file    Outpatient Encounter Prescriptions as of 09/01/2016  Medication Sig Note  . aspirin EC 81 MG tablet Take 81 mg by mouth daily.   . Cholecalciferol (VITAMIN D PO) Take by mouth daily. Reported on 04/10/2015 04/10/2015: Not taking till after surgery.   Marland Kitchen estradiol (ESTRACE) 0.1 MG/GM vaginal cream Place 1 Applicatorful vaginally 2 (two) times a week.   . Misc Natural Products (GLUCOSAMINE CHONDROITIN ADV PO) Take by mouth 2 (two) times daily. Reported on 04/10/2015 04/10/2015: Not taking till after surgery.   . Probiotic Product ([place]) Take by mouth daily. Reported on 04/10/2015 04/10/2015: Not taking till after surgery.   .  pyridOXINE (VITAMIN B-6) 100 MG tablet Take 100 mg by mouth daily. Reported on 04/10/2015 04/10/2015: Not taking till after surgery.   . TURMERIC PO Take by mouth.   . [DISCONTINUED] sertraline (ZOLOFT) 100 MG tablet Take 1.5 tablets (150 mg total) by mouth daily.    No facility-administered encounter medications on file as of 09/01/2016.     Functional Ability / Safety Screening 1.  Was the timed Get Up and Go test longer than 30 seconds?  no 2.  Does the  patient need help with the phone, transportation, shopping,      preparing meals, housework, laundry, medications, or managing money?  no 3.  Does the patient's home have:  loose throw rugs in the hallway?   no      Grab bars in the bathroom? no      Handrails on the stairs?   yes      Poor lighting?   no 4.  Has the patient noticed any hearing difficulties?   yes per her husband, nobody else notices; patient will watch for now  Fall Risk Assessment See under rooming  Depression Screen See under rooming Depression screen [facility] 2/9 09/01/2016 02/16/2016 12/31/2015 08/31/2015 08/29/2014  Decreased Interest 0 0 2 0 1  Down, Depressed, Hopeless 1 0 1 1 1   PHQ - 2 Score 1 0 3 1 2   Altered sleeping - - 3 - -  Tired, decreased energy - - 3 - -  Change in appetite - - 0 - -  Feeling bad or failure about yourself  - - 2 - -  Trouble concentrating - - 3 - -  Moving slowly or fidgety/restless - - 1 - -  Suicidal thoughts - - 0 - -  PHQ-9 Score - - 15 - -  Difficult doing work/chores - - Somewhat difficult - -  on higher dose of sertraline; lots going on, mother moving to town, a little shaky, not depressed  Advanced Directives Does patient have a HCPOA?    yes If yes, name and contact information: Eliyah Mcshea, [phone] Does patient have a living will or MOST form?  yes  Do try to resuscitate but does not want to be left alive on machines if terminal and vegetative; see living will   Objective:   Vitals: BP 118/64   Pulse 100   Temp  98.5 F (36.9 C) (Oral)   Resp 14   Ht 5\' 6"  (1.676 m)   Wt 122 lb 14.4 oz (55.7 kg)   SpO2 97%   BMI 19.84 kg/m  Body mass index is 19.84 kg/m. No exam data present  Physical Exam  Constitutional: She appears well-developed and well-nourished. No distress.  Cardiovascular: Normal rate.   Pulmonary/Chest: Effort normal.  Abdominal: She exhibits no distension.  Skin:  Several erythematous lesions on the legs; does not follow dermatomal distribution; no vesicles; no eschars; no drainage; no halo effect  Psychiatric: Her mood appears not anxious. She does not exhibit a depressed mood.   Mood/affect:  euthymic Appearance:  Casually dressed  6CIT Screen 09/01/2016  What Year? 0 points  What month? 0 points  What time? 0 points  Count back from 20 0 points  Months in reverse 0 points  Repeat phrase 0 points  Total Score 0    Assessment & Plan:     Annual Wellness Visit  Reviewed patient's Family Medical History Reviewed and updated list of patient's medical providers Assessment of cognitive impairment was done Assessed patient's functional ability Established a written schedule for health screening [place] Completed and Reviewed  Exercise Activities and Dietary recommendations Goals    None    will start to walk more  Immunization History  Administered Date(s) Administered  . Influenza, High Dose Seasonal PF 12/11/2014, 12/31/2015  . Influenza,inj,Quad PF,36+ Mos 01/05/2015  . Influenza-Unspecified 02/20/2014  . Pneumococcal Conjugate-13 04/07/2015  . Pneumococcal Polysaccharide-23 09/01/2016  . Tdap 08/29/2014  . Zoster 12/16/2010    Health Maintenance  Topic Date Due  .  INFLUENZA VACCINE  09/14/2016  . MAMMOGRAM  02/10/2017  . COLONOSCOPY  11/09/2017  . TETANUS/TDAP  08/28/2024  . DEXA SCAN  Completed  . Hepatitis C Screening  Completed  . PNA vac Low Risk Adult  Completed    Discussed health benefits of physical activity,  and encouraged her to engage in regular exercise appropriate for her age and condition.   Meds ordered this encounter  Medications  . aspirin EC 81 MG tablet    Sig: Take 81 mg by mouth daily.    Current Outpatient Prescriptions:  .  aspirin EC 81 MG tablet, Take 81 mg by mouth daily., Disp: , Rfl:  .  Cholecalciferol (VITAMIN D PO), Take by mouth daily. Reported on 04/10/2015, Disp: , Rfl:  .  estradiol (ESTRACE) 0.1 MG/GM vaginal cream, Place 1 Applicatorful vaginally 2 (two) times a week., Disp: 42.5 g, Rfl: 11 .  Misc Natural Products (GLUCOSAMINE CHONDROITIN ADV PO), Take by mouth 2 (two) times daily. Reported on 04/10/2015, Disp: , Rfl:  .  Probiotic Product ([place]), Take by mouth daily. Reported on 04/10/2015, Disp: , Rfl:  .  pyridOXINE (VITAMIN B-6) 100 MG tablet, Take 100 mg by mouth daily. Reported on 04/10/2015, Disp: , Rfl:  .  sertraline (ZOLOFT) 100 MG tablet, 1 AND 1/2 TABLETS BY MOUTH EVERY DAY *DOSAGE INCREASE*, Disp: 45 tablet, Rfl: 6 .  TURMERIC PO, Take by mouth., Disp: , Rfl:  There are no discontinued medications.  Next Medicare Wellness Visit in 12+ months  Problem List Items Addressed This Visit      Other   Preventative health care - Primary    USPSTF grade A and B recommendations reviewed with patient; age-appropriate recommendations, preventive care, screening tests, etc discussed and encouraged; healthy living encouraged; see AVS for patient education given to patient       Other Visit Diagnoses    Need for 23-polyvalent pneumococcal polysaccharide vaccine       Relevant Orders   Pneumococcal polysaccharide vaccine 23-valent greater than or equal to 2yo subcutaneous/IM (Completed)   Insect bite, initial encounter       lesions are most consistent with insect bites, not contact derm; suggested having exterminator check out home environment first   Dermatitis       which appears consistent with insect bites; will have her contact  exterminator, no lesions appear infected at this time; watch for worsening

## 2016-09-10 ENCOUNTER — Encounter: Payer: Self-pay | Admitting: Family Medicine

## 2016-09-13 NOTE — Assessment & Plan Note (Signed)
USPSTF grade A and B recommendations reviewed with patient; age-appropriate recommendations, preventive care, screening tests, etc discussed and encouraged; healthy living encouraged; see AVS for patient education given to patient  

## 2016-09-16 ENCOUNTER — Other Ambulatory Visit: Payer: Self-pay | Admitting: Family Medicine

## 2016-10-03 DIAGNOSIS — L821 Other seborrheic keratosis: Secondary | ICD-10-CM | POA: Diagnosis not present

## 2016-10-03 DIAGNOSIS — L578 Other skin changes due to chronic exposure to nonionizing radiation: Secondary | ICD-10-CM | POA: Diagnosis not present

## 2016-10-03 DIAGNOSIS — D1800 Hemangioma unspecified site: Secondary | ICD-10-CM | POA: Diagnosis not present

## 2016-10-03 DIAGNOSIS — I8393 Asymptomatic varicose veins of bilateral lower extremities: Secondary | ICD-10-CM | POA: Diagnosis not present

## 2016-10-03 DIAGNOSIS — L812 Freckles: Secondary | ICD-10-CM | POA: Diagnosis not present

## 2016-10-03 DIAGNOSIS — D229 Melanocytic nevi, unspecified: Secondary | ICD-10-CM | POA: Diagnosis not present

## 2016-10-03 DIAGNOSIS — D485 Neoplasm of uncertain behavior of skin: Secondary | ICD-10-CM | POA: Diagnosis not present

## 2016-10-03 DIAGNOSIS — L570 Actinic keratosis: Secondary | ICD-10-CM | POA: Diagnosis not present

## 2016-11-17 DIAGNOSIS — H2513 Age-related nuclear cataract, bilateral: Secondary | ICD-10-CM | POA: Diagnosis not present

## 2016-11-22 DIAGNOSIS — Z23 Encounter for immunization: Secondary | ICD-10-CM | POA: Diagnosis not present

## 2017-01-02 DIAGNOSIS — M5136 Other intervertebral disc degeneration, lumbar region: Secondary | ICD-10-CM | POA: Diagnosis not present

## 2017-01-02 DIAGNOSIS — M9901 Segmental and somatic dysfunction of cervical region: Secondary | ICD-10-CM | POA: Diagnosis not present

## 2017-01-02 DIAGNOSIS — M542 Cervicalgia: Secondary | ICD-10-CM | POA: Diagnosis not present

## 2017-01-02 DIAGNOSIS — M9903 Segmental and somatic dysfunction of lumbar region: Secondary | ICD-10-CM | POA: Diagnosis not present

## 2017-01-09 DIAGNOSIS — M542 Cervicalgia: Secondary | ICD-10-CM | POA: Diagnosis not present

## 2017-01-09 DIAGNOSIS — M5136 Other intervertebral disc degeneration, lumbar region: Secondary | ICD-10-CM | POA: Diagnosis not present

## 2017-01-09 DIAGNOSIS — M9903 Segmental and somatic dysfunction of lumbar region: Secondary | ICD-10-CM | POA: Diagnosis not present

## 2017-01-09 DIAGNOSIS — M9901 Segmental and somatic dysfunction of cervical region: Secondary | ICD-10-CM | POA: Diagnosis not present

## 2017-01-13 DIAGNOSIS — M542 Cervicalgia: Secondary | ICD-10-CM | POA: Diagnosis not present

## 2017-01-13 DIAGNOSIS — M9901 Segmental and somatic dysfunction of cervical region: Secondary | ICD-10-CM | POA: Diagnosis not present

## 2017-01-13 DIAGNOSIS — M5136 Other intervertebral disc degeneration, lumbar region: Secondary | ICD-10-CM | POA: Diagnosis not present

## 2017-01-13 DIAGNOSIS — M9903 Segmental and somatic dysfunction of lumbar region: Secondary | ICD-10-CM | POA: Diagnosis not present

## 2017-01-17 DIAGNOSIS — M5136 Other intervertebral disc degeneration, lumbar region: Secondary | ICD-10-CM | POA: Diagnosis not present

## 2017-01-17 DIAGNOSIS — M542 Cervicalgia: Secondary | ICD-10-CM | POA: Diagnosis not present

## 2017-01-17 DIAGNOSIS — M9903 Segmental and somatic dysfunction of lumbar region: Secondary | ICD-10-CM | POA: Diagnosis not present

## 2017-01-17 DIAGNOSIS — M9901 Segmental and somatic dysfunction of cervical region: Secondary | ICD-10-CM | POA: Diagnosis not present

## 2017-01-19 DIAGNOSIS — M9901 Segmental and somatic dysfunction of cervical region: Secondary | ICD-10-CM | POA: Diagnosis not present

## 2017-01-19 DIAGNOSIS — M5136 Other intervertebral disc degeneration, lumbar region: Secondary | ICD-10-CM | POA: Diagnosis not present

## 2017-01-19 DIAGNOSIS — M9903 Segmental and somatic dysfunction of lumbar region: Secondary | ICD-10-CM | POA: Diagnosis not present

## 2017-01-19 DIAGNOSIS — M542 Cervicalgia: Secondary | ICD-10-CM | POA: Diagnosis not present

## 2017-01-31 DIAGNOSIS — M9901 Segmental and somatic dysfunction of cervical region: Secondary | ICD-10-CM | POA: Diagnosis not present

## 2017-01-31 DIAGNOSIS — M9903 Segmental and somatic dysfunction of lumbar region: Secondary | ICD-10-CM | POA: Diagnosis not present

## 2017-01-31 DIAGNOSIS — M542 Cervicalgia: Secondary | ICD-10-CM | POA: Diagnosis not present

## 2017-01-31 DIAGNOSIS — M5136 Other intervertebral disc degeneration, lumbar region: Secondary | ICD-10-CM | POA: Diagnosis not present

## 2017-02-03 DIAGNOSIS — M9903 Segmental and somatic dysfunction of lumbar region: Secondary | ICD-10-CM | POA: Diagnosis not present

## 2017-02-03 DIAGNOSIS — M9901 Segmental and somatic dysfunction of cervical region: Secondary | ICD-10-CM | POA: Diagnosis not present

## 2017-02-03 DIAGNOSIS — M542 Cervicalgia: Secondary | ICD-10-CM | POA: Diagnosis not present

## 2017-02-03 DIAGNOSIS — M5136 Other intervertebral disc degeneration, lumbar region: Secondary | ICD-10-CM | POA: Diagnosis not present

## 2017-02-10 DIAGNOSIS — M542 Cervicalgia: Secondary | ICD-10-CM | POA: Diagnosis not present

## 2017-02-10 DIAGNOSIS — M9903 Segmental and somatic dysfunction of lumbar region: Secondary | ICD-10-CM | POA: Diagnosis not present

## 2017-02-10 DIAGNOSIS — M9901 Segmental and somatic dysfunction of cervical region: Secondary | ICD-10-CM | POA: Diagnosis not present

## 2017-02-10 DIAGNOSIS — M5136 Other intervertebral disc degeneration, lumbar region: Secondary | ICD-10-CM | POA: Diagnosis not present

## 2017-02-15 IMAGING — CT CT ABDOMEN W/ CM
3 of 5 series · 17 of 46 positions shown, 19 images · IV contrast (omnipaque)
Comparison: None.

CLINICAL DATA: Stomach ulcers and reflux for 2 years. Left fullness
in upper abdomen on exam.

EXAM:
CT ABDOMEN WITH CONTRAST
TECHNIQUE: Multidetector CT imaging of the abdomen was performed using the
standard protocol following bolus administration of intravenous
contrast.
CONTRAST:  85 cc Omnipaque 300 IV

[Series 2: routine with · axial · 0.62mm/px · z∈[-754,-558]mm · 12 of 47 slices shown, 14 images]
[im 4/47  soft-tissue]
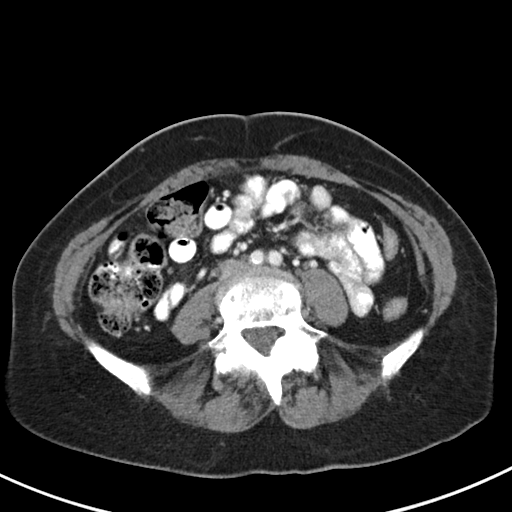
[im 4/47  bone]
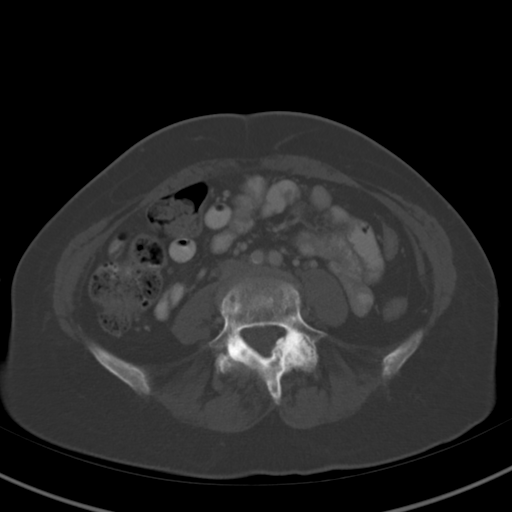
[im 8/47  soft-tissue]
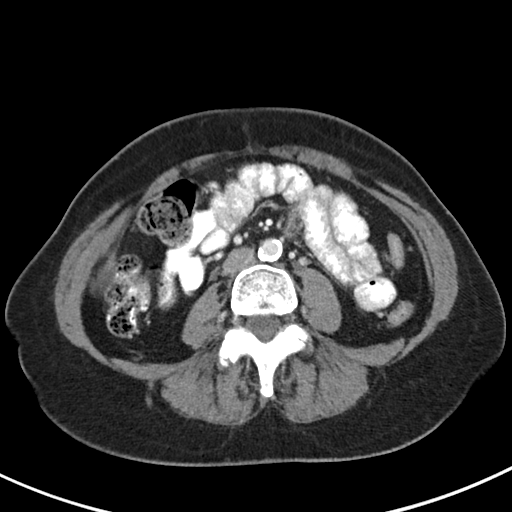
[im 11/47  soft-tissue]
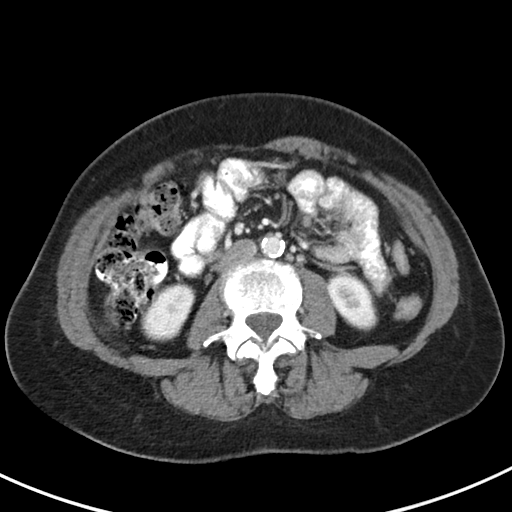
[im 15/47  soft-tissue]
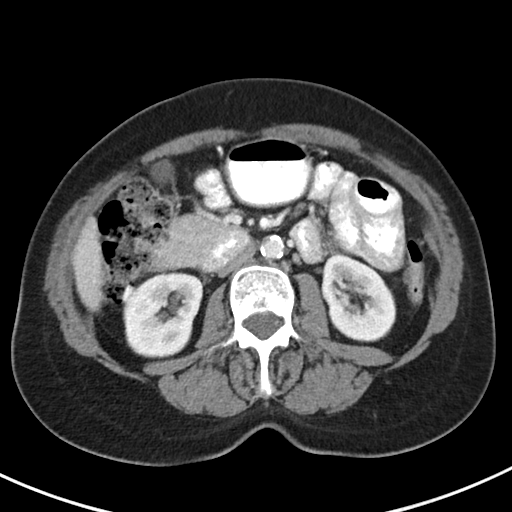
[im 18/47  soft-tissue]
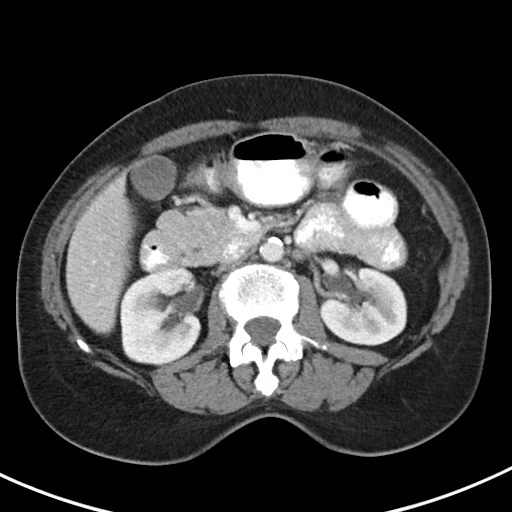
[im 22/47  soft-tissue]
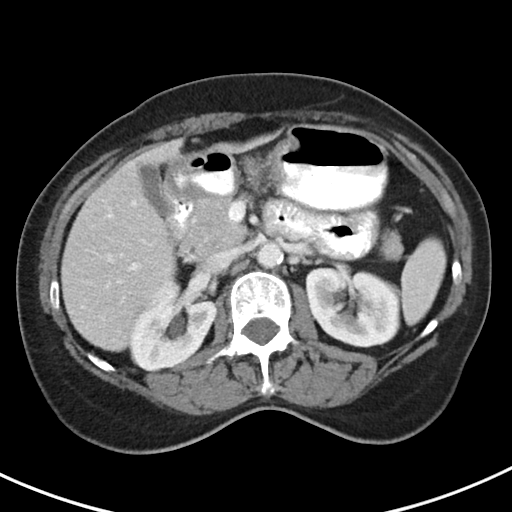
[im 25/47  soft-tissue]
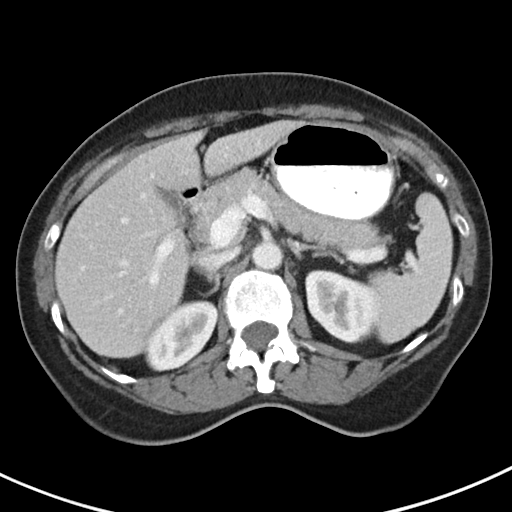
[im 29/47  soft-tissue]
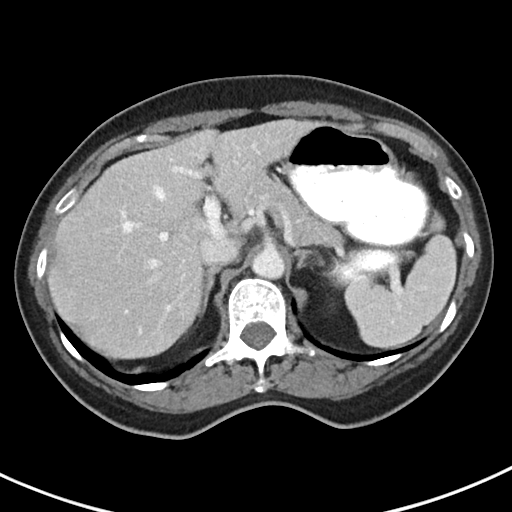
[im 32/47  soft-tissue]
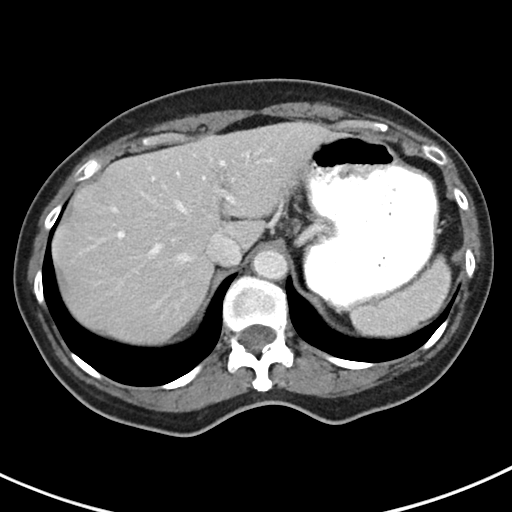
[im 32/47  bone]
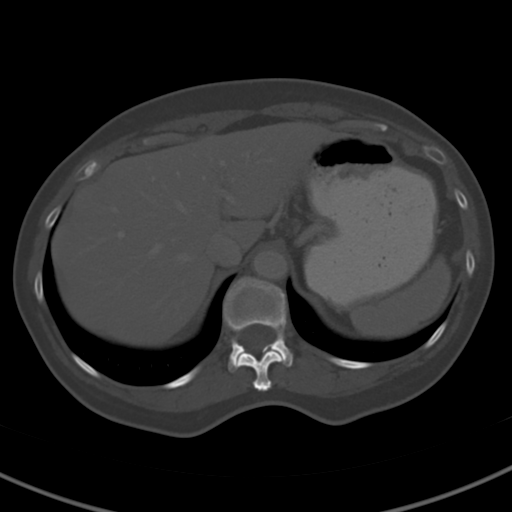
[im 36/47  soft-tissue]
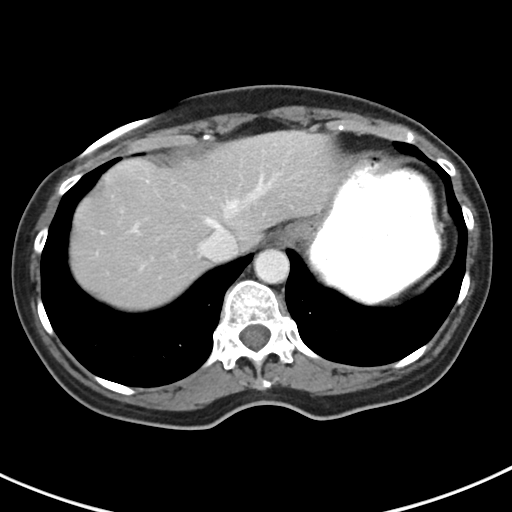
[im 39/47  soft-tissue]
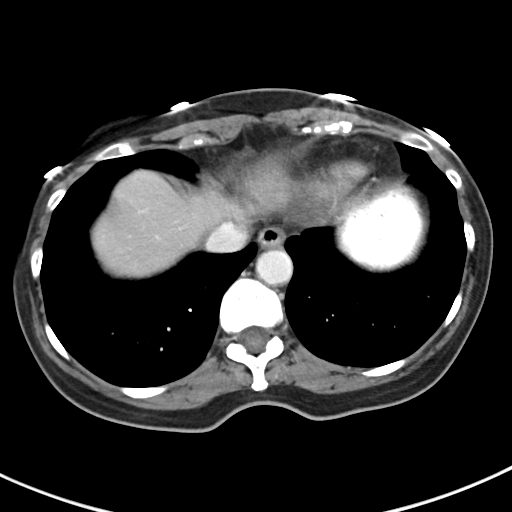
[im 43/47  soft-tissue]
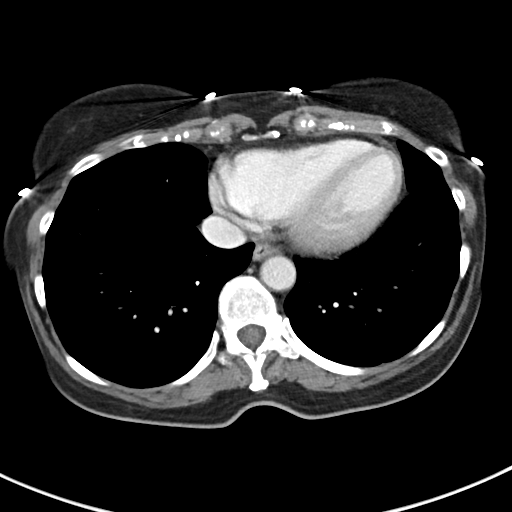

[Series 5: lung · axial · 0.62mm/px · z∈[-638,-624]mm · 2 of 24 slices shown]
[im 4/24  bone]
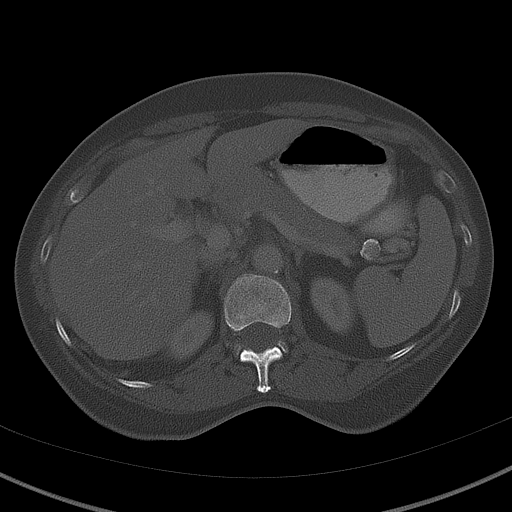
[im 7/24  bone]
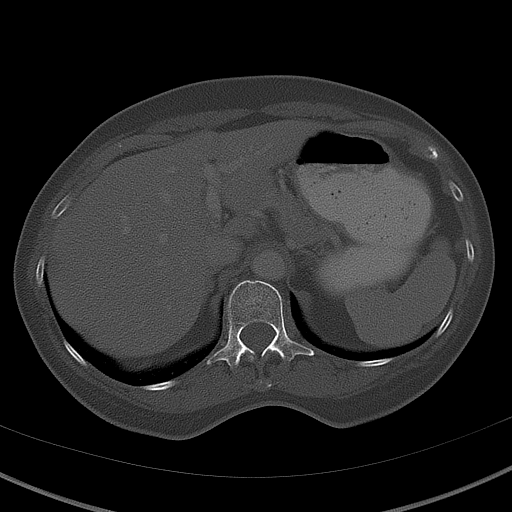

[Series 6: cor routine with · coronal · 0.46mm/px · 3 of 116 slices shown]
[im 39/116  soft-tissue]
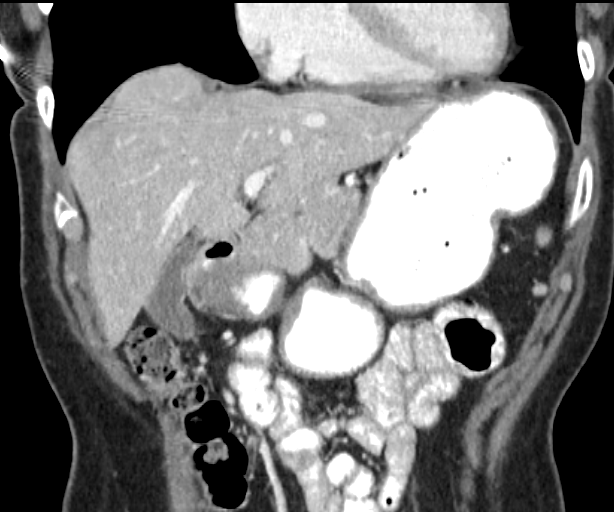
[im 52/116  soft-tissue]
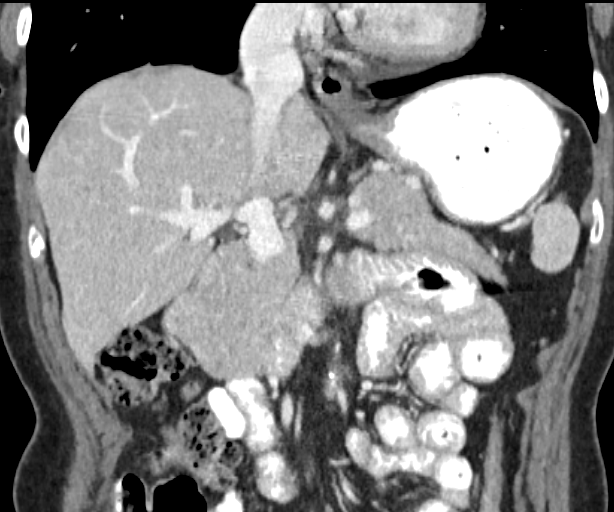
[im 64/116  soft-tissue]
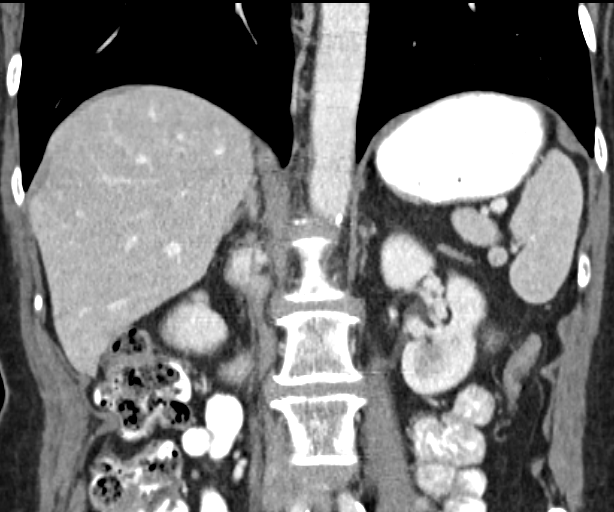

[17 of 46 positions shown; findings below may reference images not displayed]

FINDINGS: Lung bases are clear.  No effusions.  Heart is normal size.

Liver, gallbladder, spleen, pancreas, adrenals and kidneys are
normal. Calcified splenic artery aneurysm in the splenic hilum
measuring 11 mm with diffuse rim calcifications.

3 mm stone in the lower pole of the left kidney. No right renal
stone. No ureteral stones or hydronephrosis.

Stomach, large and small bowel are unremarkable. No free fluid, free
air or adenopathy. Aortic calcifications diffusely without aneurysm.

No acute bony abnormality or focal bone lesion.
IMPRESSION: No mass in the left upper quadrant to explain palpable fullness.

Small splenic artery aneurysm.

Left lower pole nephrolithiasis.

## 2017-03-16 ENCOUNTER — Other Ambulatory Visit: Payer: Self-pay | Admitting: Family Medicine

## 2017-03-16 DIAGNOSIS — N952 Postmenopausal atrophic vaginitis: Secondary | ICD-10-CM

## 2017-03-16 MED ORDER — ESTRADIOL 0.1 MG/GM VA CREA: 1.0000 | TOPICAL_CREAM | VAGINAL | 0 refills | Status: DC

## 2017-03-16 NOTE — Telephone Encounter (Signed)
She is overdue for mammogram Please order, urge her to have this done soon to continue HRT Also, update flu shot if appropriate Thank you

## 2017-03-16 NOTE — Telephone Encounter (Signed)
Pt states that this rx was sent to [facility], however she has since decided to use [facility] pharmacy. Pt is asking that we send RX to new pharmacy.

## 2017-03-16 NOTE — Telephone Encounter (Signed)
Copied from [place] [id number]. Topic: Quick Communication - Rx Refill/Question >> Mar 16, 2017  2:26 PM Wynetta Emery, [place] C wrote:    Medication: Estradiol (ESTRACE) 0.1 MG/GM vaginal cream    Has the patient contacted their pharmacy? no    Preferred Pharmacy (with phone number or street name): [place], [place] - [place]: Please be advised that RX refills may take up to 3 business days. We ask that you follow-up with your pharmacy.

## 2017-03-17 NOTE — Telephone Encounter (Signed)
Called pt no answer. Unable to leave message as no voicemail is set up. CRM created.

## 2017-03-31 ENCOUNTER — Emergency Department
Admission: EM | Admit: 2017-03-31 | Discharge: 2017-03-31 | Disposition: A | Discharge status: Home / Self Care | Payer: Medicare HMO | Attending: Emergency Medicine | Admitting: Emergency Medicine

## 2017-03-31 ENCOUNTER — Encounter: Payer: Self-pay | Admitting: Emergency Medicine

## 2017-03-31 ENCOUNTER — Telehealth: Payer: Self-pay | Admitting: Cardiovascular Disease

## 2017-03-31 ENCOUNTER — Emergency Department: Payer: Medicare HMO

## 2017-03-31 ENCOUNTER — Other Ambulatory Visit: Payer: Self-pay

## 2017-03-31 DIAGNOSIS — Z96641 Presence of right artificial hip joint: Secondary | ICD-10-CM | POA: Diagnosis not present

## 2017-03-31 DIAGNOSIS — M79602 Pain in left arm: Secondary | ICD-10-CM | POA: Diagnosis not present

## 2017-03-31 DIAGNOSIS — I493 Ventricular premature depolarization: Secondary | ICD-10-CM | POA: Diagnosis not present

## 2017-03-31 DIAGNOSIS — R079 Chest pain, unspecified: Secondary | ICD-10-CM | POA: Diagnosis not present

## 2017-03-31 DIAGNOSIS — M79622 Pain in left upper arm: Secondary | ICD-10-CM | POA: Diagnosis not present

## 2017-03-31 LAB — BASIC METABOLIC PANEL
Anion gap: 7 (ref 5–15)
BUN: 16 mg/dL (ref 6–20)
CHLORIDE: 103 mmol/L (ref 101–111)
CO2: 28 mmol/L (ref 22–32)
CREATININE: 0.56 mg/dL (ref 0.44–1.00)
Calcium: 9.3 mg/dL (ref 8.9–10.3)
GFR calc Af Amer: >60 mL/min (ref >60)
GFR calc non Af Amer: >60 mL/min (ref >60)
Glucose, Bld: 138 mg/dL — ABNORMAL HIGH (ref 65–99)
Potassium: 4 mmol/L (ref 3.5–5.1)
Sodium: 138 mmol/L (ref 135–145)

## 2017-03-31 LAB — CBC
HCT: 43 % (ref 35.0–47.0)
Hemoglobin: 14.3 g/dL (ref 12.0–16.0)
MCH: 29.9 pg (ref 26.0–34.0)
MCHC: 33.2 g/dL (ref 32.0–36.0)
MCV: 90 fL (ref 80.0–100.0)
PLATELETS: 217 10*3/uL (ref 150–440)
RBC: 4.78 MIL/uL (ref 3.80–5.20)
RDW: 13.3 % (ref 11.5–14.5)
WBC: 5 10*3/uL (ref 3.6–11.0)

## 2017-03-31 LAB — TROPONIN I
Troponin I: <0.03 ng/mL (ref <0.03)
Troponin I: <0.03 ng/mL (ref <0.03)

## 2017-03-31 NOTE — ED Provider Notes (Signed)
[facility] Emergency Department Provider Note  ____________________________________________  Time seen: Approximately 5:35 PM  I have reviewed the triage vital signs and the nursing notes.   HISTORY  Chief Complaint Chest Pain and Arm Pain    HPI Shannon Weber is a 67 y.o. female w/ FH of CAD presenting w/ LUE pain.  The she reports she has started a new yoga routine for the past month and over the last 2 weeks, she has noted a constant left upper extremity pain which goes from the shoulder all the way to the wrist and is positional, most noted when she lifts her arm above her head.  She initially thought this was due to her yoga practice, and took Motrin once which seemed to help.  This morning, at 4 AM, she woke up with a sharp severe pain from the left shoulder to just above the elbow which was different.  She did not have any associated chest pain, diaphoresis, palpitations, lightheadedness or syncope.  She has not had any trauma, neck pain, fever, cough or cold symptoms.  She was most concerned because of her father's history of CAD.  She has never had a cardiac risk stratification study in the past.  FH: Father with HTN, CAD in his 35s and 22s. SH: Biomedical engineer.  Denies tobacco, cocaine.  Past Medical History:  Diagnosis Date  . Abnormal serum lipase level   . 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 Chocolate cyst of ovary   . Fibroid   . Hypercholesteremia   . Inflammatory polyps    Gall bladder (2)  . 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 Onychomycosis     Patient Active Problem List   Diagnosis Date Noted  . At risk for side effect of medication 08/08/2016  . Follow-up examination after orthopedic surgery 03/29/2016  . 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 Abnormality of right breast on screening mammography 02/21/2016  . Encounter for hepatitis C screening test for low risk patient 02/16/2016  . Medication monitoring encounter 12/31/2015   . Preventative health care 11/04/2015  . Postmenopausal status 08/31/2015  . Breast cancer screening 08/31/2015  . Presence of right artificial hip joint 04/13/2015  . Arthritis pain, hip 04/12/2015  . Need for Streptococcus pneumoniae vaccination 04/06/2015  . Gastroesophageal reflux disease without esophagitis 04/01/2015  . Depression 04/01/2015  . Primary osteoarthritis of right hip 03/26/2015  . PVC's (premature ventricular contractions) 01/27/2015  . Abnormal EKG 01/05/2015  . Adverse drug reaction 01/05/2015  . Hematuria, microscopic 12/11/2014  . Fatigue 12/01/2014  . Major depressive disorder, recurrent episode, mild ([place]) 08/01/2014  . High cholesterol 08/01/2014  . Splenic calcification 08/01/2014    Past Surgical History:  Procedure Laterality Date  . ABDOMINAL HYSTERECTOMY    . BREAST BIOPSY Left 1980's   negative  . BREAST CYST ASPIRATION Right 2008   negative  . COLONOSCOPY  2014  . COLONOSCOPY W/ POLYPECTOMY    . EXTRACORPOREAL SHOCK WAVE LITHOTRIPSY Left 12/25/2014   Procedure: EXTRACORPOREAL SHOCK WAVE LITHOTRIPSY (ESWL);  Surgeon: Royston Cowper, MD;  Location: [facility] ORS;  Service: Urology;  Laterality: Left;  . mole removed    . 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 TOTAL HIP ARTHROPLASTY Right [id number]    Current Outpatient Rx  . Order #: [id number] Class: Historical Med  . Order #: [id number] Class: Historical Med  . Order #: [id number] Class: Normal  . Order #: [id number] Class: Historical Med  . Order #: [id number] Class: Historical  Med  . Order #: [id number] Class: Historical Med  . Order #: [id number] Class: Normal  . Order #: [id number] Class: Historical Med    Allergies Levaquin [levofloxacin in d5w]  Family History  Problem Relation Age of Onset  . Goiter Mother   . Cancer Father        colon/lung  . Hyperlipidemia Father   . Heart disease Father 38  . Breast cancer Maternal Aunt   . Breast cancer Paternal Aunt   .  Bipolar disorder Brother   . Alzheimer's disease Maternal Grandfather   . Heart disease Paternal Grandmother     Social History Social History   Tobacco Use  . Smoking status: Never Smoker  . Smokeless tobacco: Never Used  Substance Use Topics  . Alcohol use: No    Alcohol/week: 0.0 oz  . Drug use: No    Review of Systems Constitutional: No fever/chills. Eyes: No visual changes. ENT: No sore throat. No congestion or rhinorrhea. Cardiovascular: Denies chest pain. Denies palpitations. Respiratory: Denies shortness of breath.  No cough. Gastrointestinal: No abdominal pain.  No nausea, no vomiting.  No diarrhea.  No constipation. Genitourinary: Negative for dysuria. Musculoskeletal: Negative for back pain.  Positive left upper extremity pain that is positional.  No neck pain. Skin: Negative for rash. Neurological: Negative for headaches. No focal numbness, tingling or weakness.     ____________________________________________   PHYSICAL EXAM:  VITAL SIGNS: ED Triage Vitals [03/31/17 1226]  Enc Vitals Group     BP 128/67     Pulse Rate 89     Resp 18     Temp 97.7 F (36.5 C)     Temp Source Oral     SpO2 100 %     Weight 128 lb (58.1 kg)     Height 5\' 6"  (1.676 m)     Head Circumference      Peak Flow      Pain Score 3     Pain Loc      Pain Edu?      Excl. in [place]?     Constitutional: Alert and oriented. Well appearing and in no acute distress. Answers questions appropriately. Eyes: Conjunctivae are normal.  EOMI. No scleral icterus. Head: Atraumatic. Nose: No congestion/rhinnorhea. Mouth/Throat: Mucous membranes are moist.  Neck: No stridor.  Supple.  No JVD.  No meningismus. Cardiovascular: Normal rate, regular rhythm. No murmurs, rubs or gallops.  Respiratory: Normal respiratory effort.  No accessory muscle use or retractions. Lungs CTAB.  No wheezes, rales or ronchi. Gastrointestinal: Soft, nontender and nondistended.  No guarding or rebound.  No  peritoneal signs. Musculoskeletal: The patient has full range of motion of the left wrist and elbow without pain.  She has full range of motion of the left shoulder, but with upward motion of the shoulder, her arm pain between the shoulder and elbow are reproduced.  She has no midline C-spine tenderness.  She has no overlying skin changes in the arm.  Normal left radial pulse.  Normal sensation to light touch in the bilateral upper extremities.  No LE edema. No ttp in the calves or palpable cords.  Negative Homan's sign. Neurologic:  A&Ox3.  Speech is clear.  Face and smile are symmetric.  EOMI.  Moves all extremities well. Skin:  Skin is warm, dry and intact. No rash noted. Psychiatric: Mood and affect are normal. Speech and behavior are normal.  Normal judgement.  ____________________________________________   LABS (all labs ordered are listed, but only abnormal results are  displayed)  Labs Reviewed  BASIC METABOLIC PANEL - Abnormal; Notable for the following components:      Result Value   Glucose, Bld 138 (*)    All other components within normal limits  CBC  TROPONIN I  TROPONIN I   ____________________________________________  EKG  ED ECG REPORT I, Eula Listen, the attending physician, personally viewed and interpreted this ECG.   Date: 03/31/2017  EKG Time: 1221  Rate: 81  Rhythm: normal sinus rhythm  Axis: normal  Intervals:none  ST&T Change: No STEMI. +PVC's  ____________________________________________  RADIOLOGY  Dg Chest 2 View  Result Date: 03/31/2017 CLINICAL DATA:  Chest pain EXAM: CHEST  2 VIEW COMPARISON:  04/10/2015 chest radiograph. FINDINGS: Stable cardiomediastinal silhouette with normal heart size. No pneumothorax. No pleural effusion. Lungs appear clear, with no acute consolidative airspace disease and no pulmonary edema. IMPRESSION: No active cardiopulmonary disease. Electronically Signed   By: Ilona Sorrel M.D.   On: 03/31/2017 13:12     ____________________________________________   PROCEDURES  Procedure(s) performed: None  Procedures  Critical Care performed: No ____________________________________________   INITIAL IMPRESSION / ASSESSMENT AND PLAN / ED COURSE  Pertinent labs & imaging results that were available during my care of the patient were reviewed by me and considered in my medical decision making (see chart for details).  67 y.o. female with 1-1/2 weeks of left upper extremity discomfort that is positional after starting a new yoga program, now with severe pain this morning.  Overall, the patient is hemodynamically stable.  Her EKG does not show ischemic changes.  She does have PVCs.  She has troponins that are negative x2.  Her blood counts and electrolytes are also within normal limits.  Her chest x-ray does not show any acute cardiopulmonary process.  It is likely that the patient's pain is due to either to musculoskeletal, tendinous or ligamentous strain, or possibly a pinched nerve.  ACS or MI is much less likely, however given her age and family history, it is not unreasonable for her to undergo stress test for risk stratification so I have recommended that she call Dr. Fletcher Anon for an outpatient appointment within the next several days.  Return precautions were also discussed.  The patient was offered symptomatic treatment which she deferred.  At this time, the patient is safe for discharge  ____________________________________________  FINAL CLINICAL IMPRESSION(S) / ED DIAGNOSES  Final diagnoses:  Left arm pain  PVC (premature ventricular contraction)         NEW MEDICATIONS STARTED DURING THIS VISIT:  New Prescriptions   No medications on file      Eula Listen, MD 03/31/17 1740

## 2017-03-31 NOTE — ED Triage Notes (Signed)
Pt presents to ED via POV with c/o sudden onset chest tightness that started last night with L arm pain. Pt states L arm pain has been intermittent x 1 week. Pt states last night L arm pain was different and woke her up, pain is reproducible with movement.

## 2017-03-31 NOTE — ED Notes (Signed)
Pt to desk and states she is going home to get her phone.  Pt seen walking through parking lot.

## 2017-03-31 NOTE — ED Triage Notes (Signed)
FIRST NURSE NOTE-here for chest pain/left arm pain. Ambulatory without difficulty, declined wheel chair.  Pulled next for EKG

## 2017-03-31 NOTE — Telephone Encounter (Signed)
Pt calling stating she was awoken last night with some pains in her arm and it is still going on She went to Urgent care they advised her to go ED and get labs done there  She is calling to see if we can see her or would she need to go ED   Please advise.

## 2017-03-31 NOTE — Discharge Instructions (Signed)
You may take Tylenol or Motrin for your pain, or apply ice/heat to your arm for 15 minutes every two hours to decrease pain.    Return to the emergency department if you develop severe pain, chest pain, shortness of breath, palpitations, sweaty or clammy feeling, nausea or vomiting, fever, or any other symptoms concerning to you.

## 2017-03-31 NOTE — Telephone Encounter (Signed)
Patient states that she went to urgent care this AM for left arm/shoulder pain. She states that she woke up last night with sharp pain to her chest and then left arm and shoulder. She tried ibuprofen but that did not help the pain. She states that they wanted her to have labs done at the ED and she wanted to see if we could do them here. Advised that the labs they are referring to would need to be done in the ED to rule out any heart related problems. She verbalized understanding with no further questions.

## 2017-04-01 NOTE — Telephone Encounter (Signed)
Closing out old note

## 2017-04-01 NOTE — Progress Notes (Signed)
Closing out old orders note

## 2017-04-01 NOTE — Progress Notes (Signed)
Closing out old ancillary order note

## 2017-04-01 NOTE — Progress Notes (Signed)
Closing old abstraction note

## 2017-04-01 NOTE — Progress Notes (Signed)
Closing out old note from June 2018

## 2017-04-02 ENCOUNTER — Encounter: Payer: Self-pay | Admitting: Family Medicine

## 2017-04-02 MED ORDER — SERTRALINE HCL 100 MG PO TABS: ORAL_TABLET | ORAL | 6 refills | Status: DC

## 2017-04-02 NOTE — Addendum Note (Signed)
Addended by: LADA, Satira Anis on: 04/02/2017 07:02 PM   Modules accepted: Orders

## 2017-04-03 ENCOUNTER — Telehealth: Payer: Self-pay | Admitting: Cardiovascular Disease

## 2017-04-03 NOTE — Telephone Encounter (Signed)
Lmov for patient to call back she was seen in ED   Will need to try again at a later time

## 2017-04-04 NOTE — Telephone Encounter (Signed)
lmov to schedule appt  °

## 2017-04-10 ENCOUNTER — Encounter: Payer: Self-pay | Admitting: Family Medicine

## 2017-04-10 DIAGNOSIS — R079 Chest pain, unspecified: Secondary | ICD-10-CM

## 2017-04-10 NOTE — Telephone Encounter (Signed)
lmov to schedule appt  °

## 2017-04-11 NOTE — Addendum Note (Signed)
Addended by: Marquasia Schmieder, Satira Anis on: 04/11/2017 04:11 PM   Modules accepted: Orders

## 2017-04-12 NOTE — Telephone Encounter (Signed)
Pt scheduled 04/13/17  Nothing else needed.

## 2017-04-13 ENCOUNTER — Encounter: Payer: Self-pay | Admitting: Cardiovascular Disease

## 2017-04-13 ENCOUNTER — Ambulatory Visit: Payer: Medicare HMO | Admitting: Cardiovascular Disease

## 2017-04-13 VITALS — BP 112/60 | HR 79 | Ht 66.0 in | Wt 124.0 lb

## 2017-04-13 DIAGNOSIS — R0789 Other chest pain: Secondary | ICD-10-CM | POA: Diagnosis not present

## 2017-04-13 DIAGNOSIS — I493 Ventricular premature depolarization: Secondary | ICD-10-CM | POA: Diagnosis not present

## 2017-04-13 NOTE — Patient Instructions (Addendum)
Medication Instructions:  Your physician recommends that you continue on your current medications as directed. Please refer to the Current Medication list given to you today.    Labwork: none  Testing/Procedures: Your physician has requested that you have en exercise stress myoview. For further information please visit [email or web address]. Please follow instruction sheet, as given.  [place]  Your caregiver has ordered a Stress Test with nuclear imaging. The purpose of this test is to evaluate the blood supply to your heart muscle. This procedure is referred to as a "Non-Invasive Stress Test." This is because other than having an IV started in your vein, nothing is inserted or "invades" your body. Cardiac stress tests are done to find areas of poor blood flow to the heart by determining the extent of coronary artery disease (CAD). Some patients exercise on a treadmill, which naturally increases the blood flow to your heart, while others who are  unable to walk on a treadmill due to physical limitations have a pharmacologic/chemical stress agent called Lexiscan . This medicine will mimic walking on a treadmill by temporarily increasing your coronary blood flow.   Please note: these test may take anywhere between 2-4 hours to complete  PLEASE REPORT TO [place] AT THE FIRST DESK WILL DIRECT YOU WHERE TO GO  Date of Procedure:__Wednesday, March 6_____  Arrival Time for Procedure:___7:45am__  Instructions regarding medication:   You may take your morning medications with a sip of water.    PLEASE NOTIFY THE OFFICE AT LEAST 1 HOURS IN ADVANCE IF YOU ARE UNABLE TO KEEP YOUR APPOINTMENT.  [phone] AND  PLEASE NOTIFY NUCLEAR MEDICINE AT [facility] AT LEAST 24 HOURS IN ADVANCE IF YOU ARE UNABLE TO KEEP YOUR APPOINTMENT. [phone]  How to prepare for your Myoview test:  1. Do not eat or drink after midnight 2. No caffeine for 24 hours prior to  test 3. No smoking 24 hours prior to test. 4. Your medication may be taken with water.  If your doctor stopped a medication because of this test, do not take that medication. 5. Ladies, please do not wear dresses.  Skirts or pants are appropriate. Please wear a short sleeve shirt. 6. No perfume, cologne or lotion. 7. Wear comfortable walking shoes. No heels!            Follow-Up: Your physician recommends that you schedule a follow-up appointment as needed.    Any Other Special Instructions Will Be Listed Below (If Applicable).     If you need a refill on your cardiac medications before your next appointment, please call your pharmacy.  Cardiac Nuclear Scan A cardiac nuclear scan is a test that measures blood flow to the heart when a person is resting and when he or she is exercising. The test looks for problems such as:  Not enough blood reaching a portion of the heart.  The heart muscle not working normally.  You may need this test if:  You have heart disease.  You have had abnormal lab results.  You have had heart surgery or angioplasty.  You have chest pain.  You have shortness of breath.  In this test, a radioactive dye (tracer) is injected into your bloodstream. After the tracer has traveled to your heart, an imaging device is used to measure how much of the tracer is absorbed by or distributed to various areas of your heart. This procedure is usually done at a hospital and takes 2-4 hours. Tell a health care  provider about:  Any allergies you have.  All medicines you are taking, including vitamins, herbs, eye drops, creams, and over-the-counter medicines.  Any problems you or family members have had with the use of anesthetic medicines.  Any blood disorders you have.  Any surgeries you have had.  Any medical conditions you have.  Whether you are pregnant or may be pregnant. What are the risks? Generally, this is a safe procedure. However, problems  may occur, including:  Serious chest pain and heart attack. This is only a risk if the stress portion of the test is done.  Rapid heartbeat.  Sensation of warmth in your chest. This usually passes quickly.  What happens before the procedure?  Ask your health care provider about changing or stopping your regular medicines. This is especially important if you are taking diabetes medicines or blood thinners.  Remove your jewelry on the day of the procedure. What happens during the procedure?  An IV tube will be inserted into one of your veins.  Your health care provider will inject a small amount of radioactive tracer through the tube.  You will wait for 20-40 minutes while the tracer travels through your bloodstream.  Your heart activity will be monitored with an electrocardiogram (ECG).  You will lie down on an exam table.  Images of your heart will be taken for about 15-20 minutes.  You may be asked to exercise on a treadmill or stationary bike. While you exercise, your heart's activity will be monitored with an ECG, and your blood pressure will be checked. If you are unable to exercise, you may be given a medicine to increase blood flow to parts of your heart.  When blood flow to your heart has peaked, a tracer will again be injected through the IV tube.  After 20-40 minutes, you will get back on the exam table and have more images taken of your heart.  When the procedure is over, your IV tube will be removed. The procedure may vary among health care providers and hospitals. Depending on the type of tracer used, scans may need to be repeated 3-4 hours later. What happens after the procedure?  Unless your health care provider tells you otherwise, you may return to your normal schedule, including diet, activities, and medicines.  Unless your health care provider tells you otherwise, you may increase your fluid intake. This will help flush the contrast dye from your body. Drink  enough fluid to keep your urine clear or pale yellow.  It is up to you to get your test results. Ask your health care provider, or the department that is doing the test, when your results will be ready. Summary  A cardiac nuclear scan measures the blood flow to the heart when a person is resting and when he or she is exercising.  You may need this test if you are at risk for heart disease.  Tell your health care provider if you are pregnant.  Unless your health care provider tells you otherwise, increase your fluid intake. This will help flush the contrast dye from your body. Drink enough fluid to keep your urine clear or pale yellow. This information is not intended to replace advice given to you by your health care provider. Make sure you discuss any questions you have with your health care provider. Document Released: 02/26/2004 Document Revised: 02/03/2016 Document Reviewed: 01/09/2013 Elsevier Interactive Patient Education  2017 [employer].

## 2017-04-13 NOTE — Progress Notes (Signed)
Cardiology Office Note   Date:  04/13/2017   ID:  Shannon Weber, DOB 1950-03-31, MRN [id number]  PCP:  Arnetha Courser, MD  Cardiologist:   Kathlyn Sacramento, MD   Chief Complaint  Patient presents with  . other    hospital follow up. Patient states she has nothing bothering her currently. Meds reviewed verbally with patient.       History of Present Illness: Shannon Weber is a 67 y.o. female who was referred by Dr. Sanda Klein for evaluation of chest pain.  She was seen by me in 2017 for PVCs.  She underwent an echocardiogram was overall unremarkable.  Holter monitor showed a mild amount of PVCs that did not require treatment. The patient has no history of hypertension, diabetes or hyperlipidemia.  She is not a smoker. Family history reveals that her father had extensive cardiac problems including chronic systolic heart failure, coronary artery disease and valvular disease. Recently, she started an exercise program.  She experienced left arm and neck discomfort.  She became anxious about her symptoms and started having substernal chest tightness that lasted for about 15 minutes.  She was sent to the emergency room for evaluation.  Basic workup was negative including EKG and troponin.  Outpatient stress testing was recommended.  She has not had recurrent symptoms.  She reports mild exertional dyspnea which is chronic.  Past Medical History:  Diagnosis Date  . Abnormal serum lipase level   . 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 Chocolate cyst of ovary   . Fibroid   . Hypercholesteremia   . Inflammatory polyps    Gall bladder (2)  . 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 Onychomycosis     Past Surgical History:  Procedure Laterality Date  . ABDOMINAL HYSTERECTOMY    . BREAST BIOPSY Left 1980's   negative  . BREAST CYST ASPIRATION Right 2008   negative  . COLONOSCOPY  2014  . COLONOSCOPY W/ POLYPECTOMY    . EXTRACORPOREAL SHOCK WAVE LITHOTRIPSY Left 12/25/2014   Procedure:  EXTRACORPOREAL SHOCK WAVE LITHOTRIPSY (ESWL);  Surgeon: Royston Cowper, MD;  Location: [facility] ORS;  Service: Urology;  Laterality: Left;  . mole removed    . 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 TOTAL HIP ARTHROPLASTY Right [id number]     Current Outpatient Medications  Medication Sig Dispense Refill  . aspirin EC 81 MG tablet Take 81 mg by mouth daily.    . Cholecalciferol (VITAMIN D PO) Take by mouth daily. Reported on 04/10/2015    . estradiol (ESTRACE) 0.1 MG/GM vaginal cream Place 1 Applicatorful vaginally once a week. (reduce frequency to just once a week) 42.5 g 0  . Misc Natural Products (GLUCOSAMINE CHONDROITIN ADV PO) Take by mouth 2 (two) times daily. Reported on 04/10/2015    . Probiotic Product (Castana) Take by mouth daily. Reported on 04/10/2015    . pyridOXINE (VITAMIN B-6) 100 MG tablet Take 100 mg by mouth daily. Reported on 04/10/2015    . sertraline (ZOLOFT) 100 MG tablet 1 AND 1/2 TABLETS BY MOUTH EVERY DAY 45 tablet 6  . TURMERIC PO Take by mouth.     No current facility-administered medications for this visit.     Allergies:   Levaquin [levofloxacin in d5w]    Social History:  The patient  reports that  has never smoked. she has never used smokeless tobacco. She reports that she does not drink alcohol or use  drugs.   Family History:  The patient's family history includes Alzheimer's disease in her maternal grandfather; Bipolar disorder in her brother; Breast cancer in her maternal aunt and paternal aunt; Cancer in her father; Goiter in her mother; Heart disease in her paternal grandmother; Heart disease (age of onset: 42) in her father; Hyperlipidemia in her father.    ROS:  Please see the history of present illness.   Otherwise, review of systems are positive for none.   All other systems are reviewed and negative.    PHYSICAL EXAM: VS:  BP 112/60 (BP Location: Left Arm, Patient Position: Sitting, Cuff Size: Normal)    Pulse 79   Ht 5\' 6"  (1.676 m)   Wt 124 lb (56.2 kg)   BMI 20.01 kg/m  , BMI Body mass index is 20.01 kg/m. GEN: Well nourished, well developed, in no acute distress  HEENT: normal  Neck: no JVD, carotid bruits, or masses Cardiac: RRR; no murmurs, rubs, or gallops,no edema  Respiratory:  clear to auscultation bilaterally, normal work of breathing GI: soft, nontender, nondistended, + BS MS: no deformity or atrophy  Skin: warm and dry, no rash Neuro:  Strength and sensation are intact Psych: euthymic mood, full affect   EKG:  EKG is ordered today. The ekg ordered today demonstrates normal sinus rhythm with sinus arrhythmia.  No significant ST or T wave changes.   Recent Labs: 03/31/2017: BUN 16; Creatinine, Ser 0.56; Hemoglobin 14.3; Platelets 217; Potassium 4.0; Sodium 138    Lipid Panel    Component Value Date/Time   CHOL 224 (H) 12/31/2015 1111   CHOL 208 (H) 08/01/2014 0853   TRIG 80 12/31/2015 1111   HDL 79 12/31/2015 1111   HDL 66 08/01/2014 0853   CHOLHDL 2.8 12/31/2015 1111   VLDL 16 12/31/2015 1111   LDLCALC 129 (H) 12/31/2015 1111   LDLCALC 126 (H) 08/01/2014 0853      Wt Readings from Last 3 Encounters:  04/13/17 124 lb (56.2 kg)  03/31/17 128 lb (58.1 kg)  09/01/16 122 lb 14.4 oz (55.7 kg)      No flowsheet data found.    ASSESSMENT AND PLAN:  1.  Atypical chest pain: Symptoms are overall atypical but she does have risk factors for coronary artery disease.  Thus, I recommend evaluation with a treadmill nuclear stress test.  2.  Previous PVCs: This has not been an issue for her lately with no palpitations.  No PVCs are noted by physical exam or an EKG.    Disposition:   FU with me as needed.   Signed,  Kathlyn Sacramento, MD  04/13/2017 1:24 PM    [facility]

## 2017-04-19 ENCOUNTER — Ambulatory Visit
Admission: RE | Admit: 2017-04-19 | Discharge: 2017-04-19 | Disposition: A | Discharge status: Home / Self Care | Payer: Medicare HMO | Source: Ambulatory Visit | Attending: Cardiovascular Disease | Admitting: Cardiovascular Disease

## 2017-04-19 ENCOUNTER — Encounter: Payer: Self-pay | Admitting: Family Medicine

## 2017-04-19 DIAGNOSIS — R0789 Other chest pain: Secondary | ICD-10-CM | POA: Diagnosis not present

## 2017-04-19 LAB — NM MYOCAR MULTI W/SPECT W/WALL MOTION / EF
CHL CUP RESTING HR STRESS: 65 {beats}/min
CSEPEDS: 0 s
CSEPPHR: 169 {beats}/min
Exercise duration (min): 4 min
LV dias vol: 50 mL (ref 46–106)
LVSYSVOL: 19 mL
Percent HR: 111 %
TID: 1.02

## 2017-04-19 MED ORDER — TECHNETIUM TC 99M TETROFOSMIN IV KIT
29.9900 | PACK | Freq: Once | INTRAVENOUS | Status: AC | PRN
  Administered 2017-04-19: 29.99 via INTRAVENOUS

## 2017-04-19 MED ORDER — TECHNETIUM TC 99M TETROFOSMIN IV KIT
14.0800 | PACK | Freq: Once | INTRAVENOUS | Status: AC | PRN
  Administered 2017-04-19: 14.08 via INTRAVENOUS

## 2017-04-19 NOTE — Progress Notes (Signed)
Erroneous encounter

## 2017-05-30 ENCOUNTER — Other Ambulatory Visit: Payer: Self-pay | Admitting: Family Medicine

## 2017-05-30 NOTE — Telephone Encounter (Signed)
I already sent 45 pills plus 6 refills to the same pharmacy, [facility], in Feb Too soon Please resolve with pharmacy

## 2017-06-28 DIAGNOSIS — Z09 Encounter for follow-up examination after completed treatment for conditions other than malignant neoplasm: Secondary | ICD-10-CM | POA: Diagnosis not present

## 2017-06-28 DIAGNOSIS — Z96641 Presence of right artificial hip joint: Secondary | ICD-10-CM | POA: Diagnosis not present

## 2017-08-02 DIAGNOSIS — H6980 Other specified disorders of Eustachian tube, unspecified ear: Secondary | ICD-10-CM | POA: Diagnosis not present

## 2017-09-04 ENCOUNTER — Ambulatory Visit: Payer: Medicare Other | Admitting: Family Medicine

## 2017-09-05 ENCOUNTER — Ambulatory Visit (INDEPENDENT_AMBULATORY_CARE_PROVIDER_SITE_OTHER): Payer: Medicare HMO

## 2017-09-05 VITALS — BP 110/66 | HR 70 | Temp 98.3°F | Resp 12 | Ht 66.0 in | Wt 123.0 lb

## 2017-09-05 DIAGNOSIS — E2839 Other primary ovarian failure: Secondary | ICD-10-CM | POA: Diagnosis not present

## 2017-09-05 DIAGNOSIS — Z96641 Presence of right artificial hip joint: Secondary | ICD-10-CM | POA: Diagnosis not present

## 2017-09-05 DIAGNOSIS — Z0000 Encounter for general adult medical examination without abnormal findings: Secondary | ICD-10-CM | POA: Diagnosis not present

## 2017-09-05 DIAGNOSIS — Z1211 Encounter for screening for malignant neoplasm of colon: Secondary | ICD-10-CM | POA: Diagnosis not present

## 2017-09-05 DIAGNOSIS — Z1231 Encounter for screening mammogram for malignant neoplasm of breast: Secondary | ICD-10-CM | POA: Diagnosis not present

## 2017-09-05 DIAGNOSIS — Z780 Asymptomatic menopausal state: Secondary | ICD-10-CM | POA: Diagnosis not present

## 2017-09-05 DIAGNOSIS — M1611 Unilateral primary osteoarthritis, right hip: Secondary | ICD-10-CM

## 2017-09-05 DIAGNOSIS — Z1239 Encounter for other screening for malignant neoplasm of breast: Secondary | ICD-10-CM

## 2017-09-05 NOTE — Progress Notes (Signed)
Subjective:   Shannon Weber is a 67 y.o. female who presents for Medicare Annual (Subsequent) preventive examination.  Review of Systems:  N/A Cardiac Risk Factors include: advanced age (>3men, >90 women);sedentary lifestyle     Objective:     Vitals: BP 110/66 (BP Location: Left Arm, Patient Position: Sitting, Cuff Size: Normal)   Pulse 70   Temp 98.3 F (36.8 C) (Oral)   Resp 12   Ht 5\' 6"  (1.676 m)   Wt 123 lb (55.8 kg)   SpO2 97%   BMI 19.85 kg/m   Body mass index is 19.85 kg/m.  Advanced Directives 09/05/2017 03/31/2017 09/01/2016 02/16/2016 12/31/2015 08/31/2015 12/25/2014  Does Patient Have a Medical Advance Directive? Yes Yes Yes Yes Yes Yes No  Type of Paramedic of [place];Living will [place];Living will - - Living will Living will -  Copy of [place] in Chart? No - copy requested No - copy requested - Yes No - copy requested No - copy requested -    Tobacco Social History   Tobacco Use  Smoking Status Never Smoker  Smokeless Tobacco Never Used  Tobacco Comment   smoking cessation materials not required     Counseling given: No Comment: smoking cessation materials not required  Clinical Intake:  Pre-visit preparation completed: Yes  Pain : No/denies pain   BMI - recorded: 20.01 Nutritional Status: BMI of 19-24  Normal Nutritional Risks: None Diabetes: No  How often do you need to have someone help you when you read instructions, pamphlets, or other written materials from your doctor or pharmacy?: 1 - Never  Interpreter Needed?: No  Information entered by :: AEversole, LPN  Past Medical History:  Diagnosis Date  . Abnormal serum lipase level   . 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 Chocolate cyst of ovary   . Fibroid   . Hypercholesteremia   . Inflammatory polyps    Gall bladder (2)  . 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 Onychomycosis    Past Surgical History:    Procedure Laterality Date  . ABDOMINAL HYSTERECTOMY    . BREAST BIOPSY Left 1980's   negative  . BREAST CYST ASPIRATION Right 2008   negative  . COLONOSCOPY  2014  . COLONOSCOPY W/ POLYPECTOMY    . EXTRACORPOREAL SHOCK WAVE LITHOTRIPSY Left 12/25/2014   Procedure: EXTRACORPOREAL SHOCK WAVE LITHOTRIPSY (ESWL);  Surgeon: Royston Cowper, MD;  Location: [facility] ORS;  Service: Urology;  Laterality: Left;  . mole removed    . 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 TOTAL HIP ARTHROPLASTY Right [id number]   Family History  Problem Relation Age of Onset  . Goiter Mother   . Cancer Father        colon/lung  . Hyperlipidemia Father   . Heart disease Father 24  . Hypertension Father   . Breast cancer Maternal Aunt   . Breast cancer Paternal Aunt   . Bipolar disorder Brother   . Alzheimer's disease Maternal Grandfather   . Heart disease Paternal Grandmother    Social History   Socioeconomic History  . Marital status: Married    Spouse name: Shannon Weber  . Number of children: 2  . Years of education: Not on file  . Highest education level: Bachelor's degree (e.g., BA, AB, BS)  Occupational History  . Occupation: Retired  Scientific laboratory technician  . Financial resource strain: Not hard at all  . Food insecurity:  Worry: Never true    Inability: Never true  . Transportation needs:    Medical: No    Non-medical: No  Tobacco Use  . Smoking status: Never Smoker  . Smokeless tobacco: Never Used  . Tobacco comment: smoking cessation materials not required  Substance and Sexual Activity  . Alcohol use: No    Alcohol/week: 0.0 oz  . Drug use: No  . Sexual activity: Not Currently  Lifestyle  . Physical activity:    Days per week: 2 days    Minutes per session: 40 min  . Stress: Not at all  Relationships  . Social connections:    Talks on phone: Patient refused    Gets together: Patient refused    Attends religious service: Patient refused    Active member of club or  organization: Patient refused    Attends meetings of clubs or organizations: Patient refused    Relationship status: Married  Other Topics Concern  . Not on file  Social History Narrative  . Not on file    Outpatient Encounter Medications as of 09/05/2017  Medication Sig  . aspirin EC 81 MG tablet Take 81 mg by mouth daily.  . Cholecalciferol (VITAMIN D PO) Take by mouth daily. Reported on 04/10/2015  . estradiol (ESTRACE) 0.1 MG/GM vaginal cream Place 1 Applicatorful vaginally once a week. (reduce frequency to just once a week)  . Misc Natural Products (GLUCOSAMINE CHONDROITIN ADV PO) Take by mouth 2 (two) times daily. Reported on 04/10/2015  . Probiotic Product ([place]) Take by mouth daily. Reported on 04/10/2015  . pyridOXINE (VITAMIN B-6) 100 MG tablet Take 100 mg by mouth daily. Reported on 04/10/2015  . sertraline (ZOLOFT) 100 MG tablet 1 AND 1/2 TABLETS BY MOUTH EVERY DAY  . TURMERIC PO Take by mouth.   No facility-administered encounter medications on file as of 09/05/2017.     Activities of Daily Living In your present state of health, do you have any difficulty performing the following activities: 09/05/2017  Hearing? N  Comment denies hearing aids  Vision? N  Comment wears eyeglasses  Difficulty concentrating or making decisions? N  Walking or climbing stairs? Y  Comment knee pain  Dressing or bathing? N  Doing errands, shopping? N  Preparing Food and eating ? N  Comment denies dentures  Using the Toilet? N  In the past six months, have you accidently leaked urine? Y  Comment stress incontinence  Do you have problems with loss of bowel control? N  Managing your Medications? N  Managing your Finances? N  Housekeeping or managing your Housekeeping? N  Some recent data might be hidden    Patient Care Team: Lada, Satira Anis, MD as PCP - General (Family Medicine) Burnis Kingfisher, MD as Referring Physician (Orthopedic Surgery) Manya Silvas, MD  (Gastroenterology) Beverly Gust, MD (Otolaryngology) Ralene Bathe, MD (Dermatology) Lindwood Qua, MD as Consulting Physician (Dermatopathology)    Assessment:   This is a routine wellness examination for [name].  Exercise Activities and Dietary recommendations Current Exercise Habits: Structured exercise class, Type of exercise: yoga, Time (Minutes): 45, Frequency (Times/Week): 2, Weekly Exercise (Minutes/Week): 90, Intensity: Mild, Exercise limited by: None identified  Goals    . DIET - INCREASE WATER INTAKE     Recommend to drink at least 6-8 8oz glasses of water per day.       Fall Risk Fall Risk  09/05/2017 09/01/2016 02/16/2016 12/31/2015 08/31/2015  Falls in the past year? No No  No No No  Risk for fall due to : Impaired vision;Other (Comment) - - - -  Risk for fall due to: Comment wears eyeglasses; knee pain - - - -   FALL RISK PREVENTION PERTAINING TO HOME: Is your home free of loose throw rugs in walkways, pet beds, electrical cords, etc? Yes Is there adequate lighting in your home to reduce risk of falls?  Yes Are there stairs in or around your home WITH handrails? Yes  ASSISTIVE DEVICES UTILIZED TO PREVENT FALLS: Use of a cane, walker or w/c? No Grab bars in the bathroom? Yes  Shower chair or a place to sit while bathing? No An elevated toilet seat or a handicapped toilet? Yes  Timed Get Up and Go Performed: Yes. Pt ambulated 10 feet within 8 sec. Gait stead-fast and without the use of an assistive device. No intervention required at this time. Fall risk prevention has been discussed.  Community Resource Referral:  Pt declined my offer to send [employer] Referral to Care Guide for a shower chair.  Depression Screen PHQ 2/9 Scores 09/05/2017 09/01/2016 02/16/2016 12/31/2015  PHQ - 2 Score 0 1 0 3  PHQ- 9 Score 0 - - 15     Cognitive Function     6CIT Screen 09/05/2017 09/01/2016  What Year? 0 points 0 points  What month? 0 points 0 points  What  time? 0 points 0 points  Count back from 20 0 points 0 points  Months in reverse 0 points 0 points  Repeat phrase 0 points 0 points  Total Score 0 0    Immunization History  Administered Date(s) Administered  . Influenza, High Dose Seasonal PF 12/11/2014, 12/31/2015  . Influenza,inj,Quad PF,6+ Mos 01/05/2015  . Influenza-Unspecified 02/20/2014, 11/22/2016  . Pneumococcal Conjugate-13 04/07/2015  . Pneumococcal Polysaccharide-23 09/01/2016  . Tdap 08/29/2014  . Zoster 12/16/2010    Qualifies for Shingles Vaccine? Yes. Zostavax completed 12/16/10. Due for Shingrix. Education has been provided regarding the importance of this vaccine. Pt has been advised to call insurance company to determine out of pocket expense. Advised may also receive vaccine at local pharmacy or Health Dept. Verbalized acceptance and understanding.  Screening Tests Health Maintenance  Topic Date Due  . MAMMOGRAM  02/10/2017  . INFLUENZA VACCINE  09/14/2017  . COLONOSCOPY  11/09/2017  . TETANUS/TDAP  08/28/2024  . DEXA SCAN  Completed  . Hepatitis C Screening  Completed  . PNA vac Low Risk Adult  Completed    Cancer Screenings: Lung: Low Dose CT Chest recommended if Age 7-80 years, 30 pack-year currently smoking OR have quit w/in 15years. Patient does not qualify. Breast:  Up to date on Mammogram? No. Completed 02/24/16. Repeat every year. Ordered today. Message sent to referral coordinator for scheduling purposes. Pt aware that she will receive a call from our office regarding her appt.   Up to date of Bone Density/Dexa? No. Completed 02/11/16. Results reflect normal. Repeat every 2 years. Ordered today. Message sent to referral coordinator for scheduling purposes. Pt aware that she will receive a call from our office regarding her appt. Colorectal: Completed 11/09/12. Repeat every 5 years. GI referral placed today. Message sent to referral coordinator for scheduling purposes. Pt aware that she will receive a  call from our office regarding her appt.   Additional Screenings: Hepatitis C Screening: Completed 02/16/16    Plan:  I have personally reviewed and addressed the Medicare Annual Wellness questionnaire and have noted the following in the patient's chart:  A. Medical and social history B. Use of alcohol, tobacco or illicit drugs  C. Current medications and supplements D. Functional ability and status E.  Nutritional status F.  Physical activity G. Advance directives H. List of other physicians I.  Hospitalizations, surgeries, and ER visits in previous 12 months J.  [place] such as hearing and vision if needed, cognitive and depression L. Referrals and appointments  In addition, I have reviewed and discussed with patient certain preventive protocols, quality metrics, and best practice recommendations. A written personalized care plan for preventive services as well as general preventive health recommendations were provided to patient.  See attached scanned questionnaire for additional information.   Signed,  Aleatha Borer, LPN Nurse Health Advisor

## 2017-09-05 NOTE — Patient Instructions (Signed)
Shannon Weber , Thank you for taking time to come for your Medicare Wellness Visit. I appreciate your ongoing commitment to your health goals. Please review the following plan we discussed and let me know if I can assist you in the future.   Screening recommendations/referrals: Colorectal Screening: Ordered today. You will receive a call from our office regarding your appointment Mammogram: Ordered today. You will receive a call from our office regarding your appointment Bone Density: Ordered today. You will receive a call from our office regarding your appointment  Vision and Dental Exams: Recommended annual ophthalmology exams for early detection of glaucoma and other disorders of the eye Recommended annual dental exams for proper oral hygiene  Vaccinations: Influenza vaccine: Up to date Pneumococcal vaccine: Up to date Tdap vaccine: Up to date Shingles vaccine: Please call your insurance company to determine your out of pocket expense for the Shingrix vaccine. You may also receive this vaccine at your local pharmacy or Health Dept.    Advanced directives: Please bring a copy of your POA (Power of Attorney) and/or Living Will to your next appointment.  Goals: Recommend to drink at least 6-8 8oz glasses of water per day.  Next appointment: Please schedule your Annual Wellness Visit with your Nurse Health Advisor in one year.  Preventive Care 70 Years and Older, Female Preventive care refers to lifestyle choices and visits with your health care provider that can promote health and wellness. What does preventive care include?  A yearly physical exam. This is also called an annual well check.  Dental exams once or twice a year.  Routine eye exams. Ask your health care provider how often you should have your eyes checked.  Personal lifestyle choices, including:  Daily care of your teeth and gums.  Regular physical activity.  Eating a healthy diet.  Avoiding tobacco and drug  use.  Limiting alcohol use.  Practicing safe sex.  Taking low-dose aspirin every day.  Taking vitamin and mineral supplements as recommended by your health care provider. What happens during an annual well check? The services and screenings done by your health care provider during your annual well check will depend on your age, overall health, lifestyle risk factors, and family history of disease. Counseling  Your health care provider may ask you questions about your:  Alcohol use.  Tobacco use.  Drug use.  Emotional well-being.  Home and relationship well-being.  Sexual activity.  Eating habits.  History of falls.  Memory and ability to understand (cognition).  Work and work Statistician.  Reproductive health. Screening  You may have the following tests or measurements:  Height, weight, and BMI.  Blood pressure.  Lipid and cholesterol levels. These may be checked every 5 years, or more frequently if you are over 65 years old.  Skin check.  Lung cancer screening. You may have this screening every year starting at age 40 if you have a 30-pack-year history of smoking and currently smoke or have quit within the past 15 years.  Fecal occult blood test (FOBT) of the stool. You may have this test every year starting at age 48.  Flexible sigmoidoscopy or colonoscopy. You may have a sigmoidoscopy every 5 years or a colonoscopy every 10 years starting at age 102.  Hepatitis C blood test.  Hepatitis B blood test.  Sexually transmitted disease (STD) testing.  Diabetes screening. This is done by checking your blood sugar (glucose) after you have not eaten for a while (fasting). You may have this done every  1-3 years.  Bone density scan. This is done to screen for osteoporosis. You may have this done starting at age 30.  Mammogram. This may be done every 1-2 years. Talk to your health care provider about how often you should have regular mammograms. Talk with your  health care provider about your test results, treatment options, and if necessary, the need for more tests. Vaccines  Your health care provider may recommend certain vaccines, such as:  Influenza vaccine. This is recommended every year.  Tetanus, diphtheria, and acellular pertussis (Tdap, Td) vaccine. You may need a Td booster every 10 years.  Zoster vaccine. You may need this after age 64.  Pneumococcal 13-valent conjugate (PCV13) vaccine. One dose is recommended after age 39.  Pneumococcal polysaccharide (PPSV23) vaccine. One dose is recommended after age 57. Talk to your health care provider about which screenings and vaccines you need and how often you need them. This information is not intended to replace advice given to you by your health care provider. Make sure you discuss any questions you have with your health care provider. Document Released: 02/27/2015 Document Revised: 10/21/2015 Document Reviewed: 12/02/2014 Elsevier Interactive Patient Education  2017 [place] Prevention in the Home Falls can cause injuries. They can happen to people of all ages. There are many things you can do to make your home safe and to help prevent falls. What can I do on the outside of my home?  Regularly fix the edges of walkways and driveways and fix any cracks.  Remove anything that might make you trip as you walk through a door, such as a raised step or threshold.  Trim any bushes or trees on the path to your home.  Use bright outdoor lighting.  Clear any walking paths of anything that might make someone trip, such as rocks or tools.  Regularly check to see if handrails are loose or broken. Make sure that both sides of any steps have handrails.  Any raised decks and porches should have guardrails on the edges.  Have any leaves, snow, or ice cleared regularly.  Use sand or salt on walking paths during winter.  Clean up any spills in your garage right away. This includes oil  or grease spills. What can I do in the bathroom?  Use night lights.  Install grab bars by the toilet and in the tub and shower. Do not use towel bars as grab bars.  Use non-skid mats or decals in the tub or shower.  If you need to sit down in the shower, use a plastic, non-slip stool.  Keep the floor dry. Clean up any water that spills on the floor as soon as it happens.  Remove soap buildup in the tub or shower regularly.  Attach bath mats securely with double-sided non-slip rug tape.  Do not have throw rugs and other things on the floor that can make you trip. What can I do in the bedroom?  Use night lights.  Make sure that you have a light by your bed that is easy to reach.  Do not use any sheets or blankets that are too big for your bed. They should not hang down onto the floor.  Have a firm chair that has side arms. You can use this for support while you get dressed.  Do not have throw rugs and other things on the floor that can make you trip. What can I do in the kitchen?  Clean up any spills right away.  Avoid walking on wet floors.  Keep items that you use a lot in easy-to-reach places.  If you need to reach something above you, use a strong step stool that has a grab bar.  Keep electrical cords out of the way.  Do not use floor polish or wax that makes floors slippery. If you must use wax, use non-skid floor wax.  Do not have throw rugs and other things on the floor that can make you trip. What can I do with my stairs?  Do not leave any items on the stairs.  Make sure that there are handrails on both sides of the stairs and use them. Fix handrails that are broken or loose. Make sure that handrails are as [place] as the stairways.  Check any carpeting to make sure that it is firmly attached to the stairs. Fix any carpet that is loose or worn.  Avoid having throw rugs at the top or bottom of the stairs. If you do have throw rugs, attach them to the floor with  carpet tape.  Make sure that you have a light switch at the top of the stairs and the bottom of the stairs. If you do not have them, ask someone to add them for you. What else can I do to help prevent falls?  Wear shoes that:  Do not have high heels.  Have rubber bottoms.  Are comfortable and fit you well.  Are closed at the toe. Do not wear sandals.  If you use a stepladder:  Make sure that it is fully opened. Do not climb a closed stepladder.  Make sure that both sides of the stepladder are locked into place.  Ask someone to hold it for you, if possible.  Clearly mark and make sure that you can see:  Any grab bars or handrails.  First and last steps.  Where the edge of each step is.  Use tools that help you move around (mobility aids) if they are needed. These include:  Canes.  Walkers.  Scooters.  Crutches.  Turn on the lights when you go into a dark area. Replace any light bulbs as soon as they burn out.  Set up your furniture so you have a clear path. Avoid moving your furniture around.  If any of your floors are uneven, fix them.  If there are any pets around you, be aware of where they are.  Review your medicines with your doctor. Some medicines can make you feel dizzy. This can increase your chance of falling. Ask your doctor what other things that you can do to help prevent falls. This information is not intended to replace advice given to you by your health care provider. Make sure you discuss any questions you have with your health care provider. Document Released: 11/27/2008 Document Revised: 07/09/2015 Document Reviewed: 03/07/2014 Elsevier Interactive Patient Education  2017 [employer].

## 2017-10-13 DIAGNOSIS — Z86010 Personal history of colonic polyps: Secondary | ICD-10-CM | POA: Diagnosis not present

## 2017-10-13 DIAGNOSIS — Z800 Family history of malignant neoplasm of digestive organs: Secondary | ICD-10-CM | POA: Diagnosis not present

## 2017-11-21 DIAGNOSIS — K640 First degree hemorrhoids: Secondary | ICD-10-CM | POA: Diagnosis not present

## 2017-11-21 DIAGNOSIS — Z1211 Encounter for screening for malignant neoplasm of colon: Secondary | ICD-10-CM | POA: Diagnosis not present

## 2017-11-21 DIAGNOSIS — Z86010 Personal history of colonic polyps: Secondary | ICD-10-CM | POA: Diagnosis not present

## 2017-11-21 DIAGNOSIS — Z8371 Family history of colonic polyps: Secondary | ICD-10-CM | POA: Diagnosis not present

## 2017-11-21 DIAGNOSIS — D120 Benign neoplasm of cecum: Secondary | ICD-10-CM | POA: Diagnosis not present

## 2017-11-21 DIAGNOSIS — Z800 Family history of malignant neoplasm of digestive organs: Secondary | ICD-10-CM | POA: Diagnosis not present

## 2017-11-21 LAB — HM COLONOSCOPY

## 2017-12-05 ENCOUNTER — Other Ambulatory Visit: Payer: Self-pay | Admitting: Family Medicine

## 2017-12-05 DIAGNOSIS — N952 Postmenopausal atrophic vaginitis: Secondary | ICD-10-CM

## 2017-12-09 ENCOUNTER — Other Ambulatory Visit: Payer: Self-pay | Admitting: Family Medicine

## 2018-01-22 ENCOUNTER — Ambulatory Visit (INDEPENDENT_AMBULATORY_CARE_PROVIDER_SITE_OTHER): Payer: Medicare HMO

## 2018-01-22 DIAGNOSIS — Z23 Encounter for immunization: Secondary | ICD-10-CM

## 2018-02-01 DIAGNOSIS — B360 Pityriasis versicolor: Secondary | ICD-10-CM | POA: Diagnosis not present

## 2018-02-01 DIAGNOSIS — D2371 Other benign neoplasm of skin of right lower limb, including hip: Secondary | ICD-10-CM | POA: Diagnosis not present

## 2018-02-01 DIAGNOSIS — L918 Other hypertrophic disorders of the skin: Secondary | ICD-10-CM | POA: Diagnosis not present

## 2018-02-05 ENCOUNTER — Other Ambulatory Visit: Payer: Self-pay | Admitting: Family Medicine

## 2018-02-05 MED ORDER — SERTRALINE HCL 100 MG PO TABS: ORAL_TABLET | ORAL | 0 refills | Status: DC

## 2018-02-05 NOTE — Addendum Note (Signed)
Addended by: LADA, Satira Anis on: 02/05/2018 01:13 PM   Modules accepted: Orders

## 2018-02-05 NOTE — Telephone Encounter (Signed)
Copied from [place] [id number]. Topic: Quick Communication - Rx Refill/Question >> Feb 05, 2018 10:27 AM Bea Graff, NT wrote: Medication: sertraline (ZOLOFT) 100 MG tablet   Pt requesting 4 pills. Left home without medication   Has the patient contacted their pharmacy? Yes.   (Agent: If no, request that the patient contact the pharmacy for the refill.) (Agent: If yes, when and what did the pharmacy advise?)  Preferred Pharmacy (with phone number or street name): [id number] [address] [place], [place] - [place] [phone] (Phone) [phone] (Fax)    Agent: Please be advised that RX refills may take up to 3 business days. We ask that you follow-up with your pharmacy.

## 2018-02-05 NOTE — Telephone Encounter (Signed)
Rx sent 

## 2018-03-08 DIAGNOSIS — H524 Presbyopia: Secondary | ICD-10-CM | POA: Diagnosis not present

## 2018-03-20 ENCOUNTER — Other Ambulatory Visit: Payer: Self-pay

## 2018-03-20 NOTE — Telephone Encounter (Signed)
Patient has not been seen by me since July of 2018 She did have her Medicare Wellness visit, but we don't really address problems or medicines at those visit We'll ask her to schedule a visit with me soon, then back to me for Rx

## 2018-03-21 MED ORDER — SERTRALINE HCL 100 MG PO TABS: ORAL_TABLET | ORAL | 0 refills | Status: DC

## 2018-03-21 NOTE — Telephone Encounter (Signed)
No, looking back looks like she went out of town and forgot med and you gave her limited supply.

## 2018-03-27 ENCOUNTER — Other Ambulatory Visit: Payer: Self-pay | Admitting: Family Medicine

## 2018-03-27 NOTE — Telephone Encounter (Signed)
Left detailed VM, CRM created.  

## 2018-03-27 NOTE — Telephone Encounter (Signed)
Pt requests 90 day supply

## 2018-03-27 NOTE — Telephone Encounter (Signed)
Please see my last note: Patient has not been seen by me since July of 2018 She did have her Medicare Wellness visit, but we don't really address problems or medicines at those visit We'll ask her to schedule a visit with me soon, then back to me for Rx

## 2018-03-27 NOTE — Telephone Encounter (Signed)
Copied from [place] [id number]. Topic: Quick Communication - Rx Refill/Question >> Mar 27, 2018 11:04 AM Rayann Heman wrote: Medication: sertraline (ZOLOFT) 100 MG tablet [[id number]]  Needs 90 day supply   Has the patient contacted their pharmacy? no Preferred Pharmacy (with phone number or street name): [facility] [place], [place] [facility] [phone] (Phone) [phone] (Fax)   Agent: Please be advised that RX refills may take up to 3 business days. We ask that you follow-up with your pharmacy.

## 2018-03-29 ENCOUNTER — Ambulatory Visit: Payer: Medicare HMO | Admitting: Family Medicine

## 2018-03-30 NOTE — Telephone Encounter (Signed)
Appt made for next tuesday

## 2018-03-30 NOTE — Telephone Encounter (Signed)
Patient needs an appt before I sent off mail order 90 day supplies One month sent to local pharmacy last week

## 2018-04-03 ENCOUNTER — Encounter: Payer: Self-pay | Admitting: Family Medicine

## 2018-04-03 ENCOUNTER — Ambulatory Visit (INDEPENDENT_AMBULATORY_CARE_PROVIDER_SITE_OTHER): Payer: Medicare HMO | Admitting: Family Medicine

## 2018-04-03 VITALS — BP 110/70 | HR 97 | Temp 98.0°F | Resp 16 | Ht 66.0 in | Wt 127.0 lb

## 2018-04-03 DIAGNOSIS — R590 Localized enlarged lymph nodes: Secondary | ICD-10-CM

## 2018-04-03 DIAGNOSIS — R61 Generalized hyperhidrosis: Secondary | ICD-10-CM

## 2018-04-03 DIAGNOSIS — F330 Major depressive disorder, recurrent, mild: Secondary | ICD-10-CM | POA: Diagnosis not present

## 2018-04-03 DIAGNOSIS — Q383 Other congenital malformations of tongue: Secondary | ICD-10-CM | POA: Diagnosis not present

## 2018-04-03 DIAGNOSIS — E7800 Pure hypercholesterolemia, unspecified: Secondary | ICD-10-CM | POA: Diagnosis not present

## 2018-04-03 DIAGNOSIS — Z5181 Encounter for therapeutic drug level monitoring: Secondary | ICD-10-CM | POA: Diagnosis not present

## 2018-04-03 DIAGNOSIS — R69 Illness, unspecified: Secondary | ICD-10-CM | POA: Diagnosis not present

## 2018-04-03 MED ORDER — CITALOPRAM HYDROBROMIDE 20 MG PO TABS: 20.0000 mg | ORAL_TABLET | Freq: Every day | ORAL | 0 refills | Status: DC

## 2018-04-03 NOTE — Patient Instructions (Addendum)
Limit vitamin D3 to no more than 1000 iu daily when not getting sun (don't take supplement if getting sun in warmer weather)   Start Celexa (citalopram) 20 mg daily After five days, reduce the sertraline from 150 mg to 100 mg daily After five more days, reduce the sertraline from 100 mg to 50 mg daily After five more days, stop the sertraline altogether Watch for signs or symptoms of serotonin syndrome and go to the emergency department or contact us with any questions  We'll get labs today We'll have you see the Ear Nose Throat specialist Return in 3-4 weeks Call sooner with any issues

## 2018-04-03 NOTE — Progress Notes (Addendum)
BP 110/70   Pulse 97   Temp 98 F (36.7 C)   Resp 16   Ht 5\' 6"  (1.676 m)   Wt 127 lb (57.6 kg)   SpO2 99%   BMI 20.50 kg/m    Subjective:    Patient ID: Shannon Weber, female    DOB: 01/25/1951, 68 y.o.   MRN: [id number]  HPI: Shannon Weber is a 68 y.o. female  Chief Complaint  Patient presents with  . Follow-up  . Medication Refill    HPI   She is here for f/u Having night sweats for 3-4 months; clothes will be wet; heard about the sertraline causing night sweats but has been on sertraline for a few years, longer than the night sweats; no fevers; no unexplained weight loss; no hx of any cancers; had colonoscopy Last mammogram was 2017; no lumps or bumps; no lumps under the arms Feels like she might have swollen glands in the neck, possibly; left side only; enlarged but not sure how [place]; no recent dentalwork; has been to dentist and no problems recently The sertraline was increased in 2018; no recent self taper I asked about her mood; "it's hard to say" she says; she has had moments of feeling impending doom but that may just be the political situation and the new reality; not super depressed or anything like that; no SI/HI Does feel some anxiety Retired a year ago last July, a little depressing because she had so many connections; not a hermit now, but not the same; 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; was the only Mudlogger for 34 years; she does senior yoga with her mother on Tuesdays and Thursdays, she is a lucky listener at [facility] and listens to children read; teaches Sunday school Not sleeping okay because of the hot flashes; tired because she is not sleeping well; husband has a lot of trouble sleeping; husband snores and he got tested for sleep apnea, no worries; patient does not Appetite is fine Taking vitamin D 1000 Hx of high cholesterol; extra lean hamburger twice a week, chicken twice a week, fish another day; drinks ultra filtered 2% milk  Lab Results    Component Value Date   CHOL 224 (H) 12/31/2015   HDL 79 12/31/2015   LDLCALC 129 (H) 12/31/2015   TRIG 80 12/31/2015   CHOLHDL 2.8 12/31/2015     Depression screen PHQ 2/9 04/03/2018 09/05/2017 09/01/2016 02/16/2016 12/31/2015  Decreased Interest 0 0 0 0 2  Down, Depressed, Hopeless 0 0 1 0 1  PHQ - 2 Score 0 0 1 0 3  Altered sleeping 1 0 - - 3  Tired, decreased energy 1 0 - - 3  Change in appetite 0 0 - - 0  Feeling bad or failure about yourself  0 0 - - 2  Trouble concentrating 0 0 - - 3  Moving slowly or fidgety/restless 0 0 - - 1  Suicidal thoughts 0 0 - - 0  PHQ-9 Score 2 0 - - 15  Difficult doing work/chores Not difficult at all Not difficult at all - - Somewhat difficult   Fall Risk  04/03/2018 09/05/2017 09/01/2016 02/16/2016 12/31/2015  Falls in the past year? 1 No No No No  Number falls in past yr: 0 - - - -  Injury with Fall? 0 - - - -  Risk for fall due to : - Impaired vision;Other (Comment) - - -  Risk for fall due to: Comment - wears eyeglasses; knee  pain - - -  Follow up Falls evaluation completed - - - -    Relevant past medical, surgical, family and social history reviewed Past Medical History:  Diagnosis Date  . Abnormal serum lipase level   . 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 Chocolate cyst of ovary   . Fibroid   . Hypercholesteremia   . Inflammatory polyps    Gall bladder (2)  . Major depressive disorder in remission ([place])   . Onychomycosis    Past Surgical History:  Procedure Laterality Date  . ABDOMINAL HYSTERECTOMY    . BREAST BIOPSY Left 1980's   negative  . BREAST CYST ASPIRATION Right 2008   negative  . COLONOSCOPY  2014  . COLONOSCOPY W/ POLYPECTOMY    . EXTRACORPOREAL SHOCK WAVE LITHOTRIPSY Left 12/25/2014   Procedure: EXTRACORPOREAL SHOCK WAVE LITHOTRIPSY (ESWL);  Surgeon: Royston Cowper, MD;  Location: [facility] ORS;  Service: Urology;  Laterality: Left;  . mole removed    . 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  TOTAL HIP ARTHROPLASTY Right [id number]   Family History  Problem Relation Age of Onset  . Goiter Mother   . Cancer Father        colon/lung  . Hyperlipidemia Father   . Heart disease Father 8  . Hypertension Father   . Breast cancer Maternal Aunt   . Breast cancer Paternal Aunt   . Bipolar disorder Brother   . Alzheimer's disease Maternal Grandfather   . Heart disease Paternal Grandmother    Social History   Tobacco Use  . Smoking status: Never Smoker  . Smokeless tobacco: Never Used  . Tobacco comment: smoking cessation materials not required  Substance Use Topics  . Alcohol use: No    Alcohol/week: 0.0 standard drinks  . Drug use: No     Office Visit from 04/03/2018 in [facility]  AUDIT-C Score  0      Interim medical history since last visit reviewed. Allergies and medications reviewed  Review of Systems Per HPI unless specifically indicated above     Objective:    BP 110/70   Pulse 97   Temp 98 F (36.7 C)   Resp 16   Ht 5\' 6"  (1.676 m)   Wt 127 lb (57.6 kg)   SpO2 99%   BMI 20.50 kg/m   Wt Readings from Last 3 Encounters:  04/03/18 127 lb (57.6 kg)  09/05/17 123 lb (55.8 kg)  04/13/17 124 lb (56.2 kg)    Physical Exam Constitutional:      General: She is not in acute distress.    Appearance: She is well-developed. She is not diaphoretic.  HENT:     Head: Normocephalic and atraumatic.     Right Ear: Ear canal normal. Tympanic membrane is not perforated or erythematous.     Left Ear: Ear canal normal. Tympanic membrane is not perforated or erythematous.     Ears:     Comments: TMs are mildly cloudy    Nose: No mucosal edema or rhinorrhea.     Mouth/Throat:     Pharynx: No oropharyngeal exudate or posterior oropharyngeal erythema.     Comments: Small dilated blood vessel LEFT side inferior surface of tongue Eyes:     General: No scleral icterus. Neck:     Thyroid: No thyromegaly.      Comments: Swollen lymph node LEFT  side neck under mandible Cardiovascular:     Rate and Rhythm: Normal rate and regular  rhythm.     Heart sounds: Normal heart sounds. No murmur.  Pulmonary:     Effort: Pulmonary effort is normal. No respiratory distress.     Breath sounds: Normal breath sounds. No wheezing.  Abdominal:     General: Bowel sounds are normal. There is no distension.     Palpations: Abdomen is soft.  Skin:    General: Skin is warm and dry.     Coloration: Skin is not pale.  Neurological:     Mental Status: She is alert.  Psychiatric:        Mood and Affect: Mood is not anxious or depressed.        Behavior: Behavior normal.        Thought Content: Thought content normal.        Judgment: Judgment normal.     Results for orders placed or performed during the hospital encounter of 04/19/17  NM Myocar Multi W/Spect W/Wall Motion / EF  Result Value Ref Range   Rest HR 65 bpm   Rest BP 143/64 mmHg   Exercise duration (sec) 0 sec   Percent HR 111 %   Exercise duration (min) 4 min   Peak HR 169 bpm   Peak BP 185/62 mmHg   TID 1.02    LV sys vol 19 mL   LV dias vol 50 46 - 106 mL      Assessment & Plan:   Problem List Items Addressed This Visit      Other   Medication monitoring encounter   Relevant Orders   CBC with Differential/Platelet   COMPLETE METABOLIC PANEL WITH GFR   High cholesterol (Chronic)   Relevant Orders   Lipid panel   Major depressive disorder, recurrent episode, mild (HCC) - Primary (Chronic)    Taper off of sertraline and start citalopram      Relevant Medications   citalopram (CELEXA) 20 MG tablet    Other Visit Diagnoses    Night sweats       may be SSRI, but with swollen gland, will check LDH and refer to ENT   Relevant Orders   Lactate Dehydrogenase (LDH)   CBC with Differential/Platelet   TSH   Cervical lymphadenopathy       refer to ENT   Relevant Orders   Ambulatory referral to ENT   Tongue abnormality       this looks like a small dilated vein, but  given proximity to swollen lymph node, will ask ENT to look also   Relevant Orders   Ambulatory referral to ENT       Follow up plan: Return in about 3 weeks (around 04/24/2018) for follow-up visit with Dr. Sanda Klein (3-4 weeks).  An after-visit summary was printed and given to the patient at [place].  Please see the patient instructions which may contain other information and recommendations beyond what is mentioned above in the assessment and plan.  Meds ordered this encounter  Medications  . DISCONTD: citalopram (CELEXA) 20 MG tablet    Sig: Take 1 tablet (20 mg total) by mouth daily.    Dispense:  30 tablet    Refill:  0  . citalopram (CELEXA) 20 MG tablet    Sig: Take 1 tablet (20 mg total) by mouth daily.    Dispense:  30 tablet    Refill:  0    Orders Placed This Encounter  Procedures  . Lactate Dehydrogenase (LDH)  . CBC with Differential/Platelet  . TSH  . Lipid  panel  . COMPLETE METABOLIC PANEL WITH GFR  . Ambulatory referral to ENT   I called patient Tuesday evening with a change in the instructions since AVS She said she would write this down Starting tomorrow: 100 mg sertraline x 6 d 10 mg of citalopram x 6 d Then... 50 sert x 6 d 20 mg citalopram x 6 d Then... Just 20 mg citalopram

## 2018-04-03 NOTE — Assessment & Plan Note (Signed)
Taper off of sertraline and start citalopram

## 2018-04-04 LAB — LACTATE DEHYDROGENASE: LDH: 148 U/L (ref 120–250)

## 2018-04-04 LAB — CBC WITH DIFFERENTIAL/PLATELET
ABSOLUTE MONOCYTES: 340 {cells}/uL (ref 200–950)
Basophils Absolute: 50 cells/uL (ref 0–200)
Basophils Relative: 1.2 %
EOS ABS: 71 {cells}/uL (ref 15–500)
EOS PCT: 1.7 %
HCT: 39.9 % (ref 35.0–45.0)
Hemoglobin: 13.3 g/dL (ref 11.7–15.5)
Lymphs Abs: 1117 cells/uL (ref 850–3900)
MCH: 29.9 pg (ref 27.0–33.0)
MCHC: 33.3 g/dL (ref 32.0–36.0)
MCV: 89.7 fL (ref 80.0–100.0)
MPV: 10.6 fL (ref 7.5–12.5)
Monocytes Relative: 8.1 %
NEUTROS PCT: 62.4 %
Neutro Abs: 2621 cells/uL (ref 1500–7800)
PLATELETS: 246 10*3/uL (ref 140–400)
RBC: 4.45 10*6/uL (ref 3.80–5.10)
RDW: 12.4 % (ref 11.0–15.0)
TOTAL LYMPHOCYTE: 26.6 %
WBC: 4.2 10*3/uL (ref 3.8–10.8)

## 2018-04-04 LAB — LIPID PANEL
CHOL/HDL RATIO: 4.2 (calc) (ref <5.0)
Cholesterol: 269 mg/dL — ABNORMAL HIGH (ref <200)
HDL: 64 mg/dL (ref >=50)
LDL Cholesterol (Calc): 177 mg/dL (calc) — ABNORMAL HIGH
NON-HDL CHOLESTEROL (CALC): 205 mg/dL — AB (ref <130)
Triglycerides: 142 mg/dL (ref <150)

## 2018-04-04 LAB — TSH: TSH: 0.69 mIU/L (ref 0.40–4.50)

## 2018-04-04 LAB — COMPLETE METABOLIC PANEL WITH GFR
AG Ratio: 1.8 (calc) (ref 1.0–2.5)
ALKALINE PHOSPHATASE (APISO): 51 U/L (ref 37–153)
ALT: 10 U/L (ref 6–29)
AST: 17 U/L (ref 10–35)
Albumin: 4.2 g/dL (ref 3.6–5.1)
BUN: 19 mg/dL (ref 7–25)
CALCIUM: 9.4 mg/dL (ref 8.6–10.4)
CO2: 29 mmol/L (ref 20–32)
CREATININE: 0.6 mg/dL (ref 0.50–0.99)
Chloride: 104 mmol/L (ref 98–110)
GFR, Est African American: 108 mL/min/{1.73_m2} (ref >=60)
GFR, Est Non African American: 93 mL/min/{1.73_m2} (ref >=60)
GLUCOSE: 61 mg/dL — AB (ref 65–99)
Globulin: 2.3 g/dL (calc) (ref 1.9–3.7)
Potassium: 3.8 mmol/L (ref 3.5–5.3)
Sodium: 141 mmol/L (ref 135–146)
Total Bilirubin: 0.4 mg/dL (ref 0.2–1.2)
Total Protein: 6.5 g/dL (ref 6.1–8.1)

## 2018-04-13 DIAGNOSIS — K148 Other diseases of tongue: Secondary | ICD-10-CM | POA: Diagnosis not present

## 2018-04-16 ENCOUNTER — Ambulatory Visit
Admission: RE | Admit: 2018-04-16 | Discharge: 2018-04-16 | Disposition: A | Discharge status: Home / Self Care | Payer: Medicare HMO | Source: Ambulatory Visit | Attending: Family Medicine | Admitting: Family Medicine

## 2018-04-16 DIAGNOSIS — Z1231 Encounter for screening mammogram for malignant neoplasm of breast: Secondary | ICD-10-CM | POA: Insufficient documentation

## 2018-04-16 DIAGNOSIS — Z1239 Encounter for other screening for malignant neoplasm of breast: Secondary | ICD-10-CM | POA: Diagnosis present

## 2018-04-27 ENCOUNTER — Ambulatory Visit: Payer: Medicare HMO | Admitting: Family Medicine

## 2018-05-05 ENCOUNTER — Encounter: Payer: Self-pay | Admitting: Family Medicine

## 2018-05-17 ENCOUNTER — Telehealth: Payer: Self-pay | Admitting: Family Medicine

## 2018-05-17 NOTE — Telephone Encounter (Signed)
appt made for tomorrow for telephone visit. Informed that prescription has been sent to pharmacy

## 2018-05-17 NOTE — Telephone Encounter (Signed)
Please schedule patient for a telehealth visit See her last MyChart note and Elizabeth Poulose's response I'll send in limited refill and we'll decide if change is needed soon

## 2018-05-18 ENCOUNTER — Encounter: Payer: Self-pay | Admitting: Family Medicine

## 2018-05-18 ENCOUNTER — Ambulatory Visit (INDEPENDENT_AMBULATORY_CARE_PROVIDER_SITE_OTHER): Payer: Medicare HMO | Admitting: Family Medicine

## 2018-05-18 DIAGNOSIS — F330 Major depressive disorder, recurrent, mild: Secondary | ICD-10-CM | POA: Diagnosis not present

## 2018-05-18 DIAGNOSIS — R69 Illness, unspecified: Secondary | ICD-10-CM | POA: Diagnosis not present

## 2018-05-18 DIAGNOSIS — M79672 Pain in left foot: Secondary | ICD-10-CM | POA: Diagnosis not present

## 2018-05-18 MED ORDER — CITALOPRAM HYDROBROMIDE 20 MG PO TABS: 20.0000 mg | ORAL_TABLET | Freq: Every day | ORAL | 1 refills | Status: DC

## 2018-05-18 NOTE — Progress Notes (Signed)
There were no vitals taken for this visit.   Subjective:    Patient ID: Shannon Weber, female    DOB: 09/05/1950, 68 y.o.   MRN: [id number]  HPI: Shannon Weber is a 68 y.o. female  Chief Complaint  Patient presents with  . Medication Refill  . Foot Pain    Onset yesterday. Left foot. When bending to walk, the top near her toes is extremely painful.    HPI Virtual Visit via Telephone Note   I connected with Varney Baas on May 18, 2018 at 8:37 hours EDT by telephone and verified that I am speaking with the correct person using two identifiers.   Staff discussed the limitations, risks, security, and privacy concerns of performing an evaluation and management service by telephone and the availability of in-person appointments. Staff discussed with the patient that he/she may be responsible for charges related to this service. The patient expressed understanding and agreed to proceed. The patient was asked to check her surroundings, be sure to keep phone off of speaker, let me know immediately if someone comes near and our conversation has to be terminated or paused for privacy concerns.  Additional participants: none  Patient location: home, husband Provider location: home  Call started: 8:37 am Call terminated: 8:53 am Total length of call: 15 minutes 53 seconds  She was taking one medicine for the depression It was changed from sertraline to citalopram She had an appt to come in but had a new grandbaby born and husband had hip replacement so she was not able to come to her appointment No sx of hypomania or mania She felt the best when she was on both switching over New medicine is better than plain sertraline No crying spells No SI/HI She has not really thought about talking with a counselor  She is having pain, "it's bizarre"; no injury, all of a sudden last night, when walking and putting weight on the front of the left foot; had terrible terrible pain; took some  tylenol and eased up a little; much better upon awakening this morning; then it did it again; husband is in great shape and walking with one crutch;she has been walking  Depression screen [facility] 2/9 05/18/2018 04/03/2018 09/05/2017 09/01/2016 02/16/2016  Decreased Interest 0 0 0 0 0  Down, Depressed, Hopeless 0 0 0 1 0  PHQ - 2 Score 0 0 0 1 0  Altered sleeping 0 1 0 - -  Tired, decreased energy 0 1 0 - -  Change in appetite 0 0 0 - -  Feeling bad or failure about yourself  0 0 0 - -  Trouble concentrating 0 0 0 - -  Moving slowly or fidgety/restless 0 0 0 - -  Suicidal thoughts 0 0 0 - -  PHQ-9 Score 0 2 0 - -  Difficult doing work/chores Not difficult at all Not difficult at all Not difficult at all - -   Fall Risk  05/18/2018 04/03/2018 09/05/2017 09/01/2016 02/16/2016  Falls in the past year? 0 1 No No No  Number falls in past yr: - 0 - - -  Injury with Fall? - 0 - - -  Risk for fall due to : - - Impaired vision;Other (Comment) - -  Risk for fall due to: Comment - - wears eyeglasses; knee pain - -  Follow up - Falls evaluation completed - - -    Relevant past medical, surgical, family and social history reviewed Past Medical History:  Diagnosis Date  . Abnormal serum lipase level   . 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 Chocolate cyst of ovary   . Fibroid   . Hypercholesteremia   . Inflammatory polyps    Gall bladder (2)  . Major depressive disorder in remission ([place])   . Onychomycosis    Past Surgical History:  Procedure Laterality Date  . ABDOMINAL HYSTERECTOMY    . BREAST BIOPSY Left 1980's   negative  . BREAST CYST ASPIRATION Right 2008   negative  . BREAST EXCISIONAL BIOPSY Left 1989   benign per pt  . COLONOSCOPY  2014  . COLONOSCOPY W/ POLYPECTOMY    . EXTRACORPOREAL SHOCK WAVE LITHOTRIPSY Left 12/25/2014   Procedure: EXTRACORPOREAL SHOCK WAVE LITHOTRIPSY (ESWL);  Surgeon: Royston Cowper, MD;  Location: [facility] ORS;  Service: Urology;  Laterality: Left;  . mole removed    .  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 TOTAL HIP ARTHROPLASTY Right [id number]   Family History  Problem Relation Age of Onset  . Goiter Mother   . Cancer Father        colon/lung  . Hyperlipidemia Father   . Heart disease Father 80  . Hypertension Father   . Breast cancer Maternal Aunt   . Breast cancer Paternal Aunt   . Bipolar disorder Brother   . Alzheimer's disease Maternal Grandfather   . Heart disease Paternal Grandmother    Social History   Tobacco Use  . Smoking status: Never Smoker  . Smokeless tobacco: Never Used  . Tobacco comment: smoking cessation materials not required  Substance Use Topics  . Alcohol use: No    Alcohol/week: 0.0 standard drinks  . Drug use: No     Office Visit from 05/18/2018 in [facility]  AUDIT-C Score  0      Interim medical history since last visit reviewed. Allergies and medications reviewed  Review of Systems Per HPI unless specifically indicated above     Objective:    There were no vitals taken for this visit.  Wt Readings from Last 3 Encounters:  04/03/18 127 lb (57.6 kg)  09/05/17 123 lb (55.8 kg)  04/13/17 124 lb (56.2 kg)    Physical Exam Pulmonary:     Effort: No respiratory distress.  Neurological:     Mental Status: She is alert.     Cranial Nerves: No dysarthria.  Psychiatric:        Speech: Speech is not rapid and pressured, delayed or slurred.     Results for orders placed or performed in visit on 04/03/18  HM COLONOSCOPY  Result Value Ref Range   HM Colonoscopy See Report (in chart) See Report (in chart), Patient Reported      Assessment & Plan:   Problem List Items Addressed This Visit      Other   Major depressive disorder, recurrent episode, mild ([place]) - Primary (Chronic)    Patient reports improvement with the citalopram; continue current dose; offered counseling, but she declined      Relevant Medications   citalopram (CELEXA) 20 MG tablet     Other Visit Diagnoses    Foot pain, left       ice bath for 10-15 minutes a few times a day, as tolerated; tylenol per package directions; avoid xrays b/c of COVID-19, try topicals       Follow up plan: No follow-ups on file.  An after-visit summary was printed and given to the patient at [place].  Please see the patient instructions which may contain other information and recommendations beyond what is mentioned above in the assessment and plan.  Meds ordered this encounter  Medications  . citalopram (CELEXA) 20 MG tablet    Sig: Take 1 tablet (20 mg total) by mouth daily.    Dispense:  90 tablet    Refill:  1    No orders of the defined types were placed in this encounter.

## 2018-05-18 NOTE — Assessment & Plan Note (Signed)
Patient reports improvement with the citalopram; continue current dose; offered counseling, but she declined

## 2018-05-23 DIAGNOSIS — Z96641 Presence of right artificial hip joint: Secondary | ICD-10-CM | POA: Diagnosis not present

## 2018-05-23 DIAGNOSIS — Z09 Encounter for follow-up examination after completed treatment for conditions other than malignant neoplasm: Secondary | ICD-10-CM | POA: Diagnosis not present

## 2018-05-29 DIAGNOSIS — G8929 Other chronic pain: Secondary | ICD-10-CM | POA: Insufficient documentation

## 2018-05-29 DIAGNOSIS — Z09 Encounter for follow-up examination after completed treatment for conditions other than malignant neoplasm: Secondary | ICD-10-CM | POA: Diagnosis not present

## 2018-06-15 ENCOUNTER — Other Ambulatory Visit: Payer: Self-pay | Admitting: Family Medicine

## 2018-07-23 ENCOUNTER — Encounter: Payer: Self-pay | Admitting: Family Medicine

## 2018-08-06 DIAGNOSIS — D239 Other benign neoplasm of skin, unspecified: Secondary | ICD-10-CM | POA: Diagnosis not present

## 2018-08-06 DIAGNOSIS — M71342 Other bursal cyst, left hand: Secondary | ICD-10-CM | POA: Diagnosis not present

## 2018-08-09 ENCOUNTER — Encounter: Payer: Self-pay | Admitting: Family Medicine

## 2018-08-14 DIAGNOSIS — R69 Illness, unspecified: Secondary | ICD-10-CM | POA: Diagnosis not present

## 2018-08-17 ENCOUNTER — Encounter: Payer: Self-pay | Admitting: Family Medicine

## 2018-09-11 ENCOUNTER — Ambulatory Visit: Payer: Medicare HMO

## 2019-05-23 DIAGNOSIS — R413 Other amnesia: Secondary | ICD-10-CM | POA: Insufficient documentation

## 2019-05-28 ENCOUNTER — Other Ambulatory Visit: Payer: Self-pay | Admitting: Family Medicine

## 2019-05-28 DIAGNOSIS — Z1231 Encounter for screening mammogram for malignant neoplasm of breast: Secondary | ICD-10-CM

## 2019-06-04 ENCOUNTER — Ambulatory Visit
Admission: RE | Admit: 2019-06-04 | Discharge: 2019-06-04 | Disposition: A | Discharge status: Home / Self Care | Payer: Medicare HMO | Source: Ambulatory Visit | Attending: Family Medicine | Admitting: Family Medicine

## 2019-06-04 DIAGNOSIS — Z1231 Encounter for screening mammogram for malignant neoplasm of breast: Secondary | ICD-10-CM

## 2020-05-01 ENCOUNTER — Other Ambulatory Visit: Payer: Self-pay | Admitting: Family Medicine

## 2020-05-01 DIAGNOSIS — Z1231 Encounter for screening mammogram for malignant neoplasm of breast: Secondary | ICD-10-CM

## 2020-06-04 ENCOUNTER — Ambulatory Visit
Admission: RE | Admit: 2020-06-04 | Discharge: 2020-06-04 | Disposition: A | Discharge status: Home / Self Care | Payer: Medicare HMO | Source: Ambulatory Visit | Attending: Family Medicine | Admitting: Family Medicine

## 2020-06-04 ENCOUNTER — Other Ambulatory Visit: Payer: Self-pay

## 2020-06-04 DIAGNOSIS — Z1231 Encounter for screening mammogram for malignant neoplasm of breast: Secondary | ICD-10-CM | POA: Diagnosis not present

## 2020-07-25 DIAGNOSIS — U071 COVID-19: Secondary | ICD-10-CM

## 2020-07-25 HISTORY — DX: COVID-19: U07.1

## 2020-07-27 ENCOUNTER — Encounter: Payer: Self-pay | Admitting: Ophthalmology

## 2020-08-10 ENCOUNTER — Encounter: Payer: Self-pay | Admitting: Ophthalmology

## 2020-08-10 ENCOUNTER — Ambulatory Visit: Payer: Medicare HMO | Admitting: Anesthesiology

## 2020-08-10 ENCOUNTER — Ambulatory Visit
Admission: RE | Admit: 2020-08-10 | Discharge: 2020-08-10 | Disposition: A | Discharge status: Home / Self Care | Payer: Medicare HMO | Attending: Ophthalmology | Admitting: Ophthalmology

## 2020-08-10 ENCOUNTER — Encounter
Admission: RE | Disposition: A | Discharge status: Home / Self Care | Payer: Self-pay | Source: Home / Self Care | Attending: Ophthalmology

## 2020-08-10 ENCOUNTER — Other Ambulatory Visit: Payer: Self-pay

## 2020-08-10 DIAGNOSIS — Z8249 Family history of ischemic heart disease and other diseases of the circulatory system: Secondary | ICD-10-CM | POA: Diagnosis not present

## 2020-08-10 DIAGNOSIS — Z79899 Other long term (current) drug therapy: Secondary | ICD-10-CM | POA: Insufficient documentation

## 2020-08-10 DIAGNOSIS — Z803 Family history of malignant neoplasm of breast: Secondary | ICD-10-CM | POA: Diagnosis not present

## 2020-08-10 DIAGNOSIS — Z8349 Family history of other endocrine, nutritional and metabolic diseases: Secondary | ICD-10-CM | POA: Insufficient documentation

## 2020-08-10 DIAGNOSIS — H2511 Age-related nuclear cataract, right eye: Secondary | ICD-10-CM | POA: Insufficient documentation

## 2020-08-10 DIAGNOSIS — Z8616 Personal history of COVID-19: Secondary | ICD-10-CM | POA: Diagnosis not present

## 2020-08-10 DIAGNOSIS — Z801 Family history of malignant neoplasm of trachea, bronchus and lung: Secondary | ICD-10-CM | POA: Diagnosis not present

## 2020-08-10 HISTORY — DX: Asymptomatic varicose veins of unspecified lower extremity: I83.90

## 2020-08-10 HISTORY — PX: CATARACT EXTRACTION W/PHACO: SHX586

## 2020-08-10 SURGERY — PHACOEMULSIFICATION, CATARACT, WITH IOL INSERTION
Anesthesia: Monitor Anesthesia Care | Site: Eye | Laterality: Right

## 2020-08-10 MED ORDER — BRIMONIDINE TARTRATE-TIMOLOL 0.2-0.5 % OP SOLN
OPHTHALMIC | Status: DC | PRN
  Administered 2020-08-10: 1 [drp] via OPHTHALMIC

## 2020-08-10 MED ORDER — ONDANSETRON HCL 4 MG/2ML IJ SOLN: 4.0000 mg | Freq: Once | INTRAMUSCULAR | Status: DC | PRN

## 2020-08-10 MED ORDER — CEFUROXIME OPHTHALMIC INJECTION 1 MG/0.1 ML
INJECTION | OPHTHALMIC | Status: DC | PRN
  Administered 2020-08-10: 0.1 mL via INTRACAMERAL

## 2020-08-10 MED ORDER — ACETAMINOPHEN 500 MG PO TABS: 1000.0000 mg | ORAL_TABLET | Freq: Once | ORAL | Status: DC | PRN

## 2020-08-10 MED ORDER — LIDOCAINE HCL (PF) 2 % IJ SOLN
INTRAOCULAR | Status: DC | PRN
  Administered 2020-08-10: 2 mL

## 2020-08-10 MED ORDER — LACTATED RINGERS IV SOLN: INTRAVENOUS | Status: DC

## 2020-08-10 MED ORDER — NA HYALUR & NA CHOND-NA HYALUR 0.4-0.35 ML IO KIT
PACK | INTRAOCULAR | Status: DC | PRN
  Administered 2020-08-10: 1 mL via INTRAOCULAR

## 2020-08-10 MED ORDER — EPINEPHRINE PF 1 MG/ML IJ SOLN
INTRAOCULAR | Status: DC | PRN
  Administered 2020-08-10: 67 mL via OPHTHALMIC

## 2020-08-10 MED ORDER — PHENYLEPHRINE HCL 10 % OP SOLN
1.0000 [drp] | OPHTHALMIC | Status: AC
  Administered 2020-08-10 (×3): 1 [drp] via OPHTHALMIC

## 2020-08-10 MED ORDER — MIDAZOLAM HCL 2 MG/2ML IJ SOLN
INTRAMUSCULAR | Status: DC | PRN
  Administered 2020-08-10: 1 mg via INTRAVENOUS

## 2020-08-10 MED ORDER — TETRACAINE HCL 0.5 % OP SOLN
1.0000 [drp] | OPHTHALMIC | Status: DC | PRN
  Administered 2020-08-10 (×3): 1 [drp] via OPHTHALMIC

## 2020-08-10 MED ORDER — CYCLOPENTOLATE HCL 2 % OP SOLN
1.0000 [drp] | OPHTHALMIC | Status: AC
  Administered 2020-08-10 (×2): 1 [drp] via OPHTHALMIC

## 2020-08-10 MED ORDER — ACETAMINOPHEN 160 MG/5ML PO SOLN: 975.0000 mg | Freq: Once | ORAL | Status: DC | PRN

## 2020-08-10 MED ORDER — FENTANYL CITRATE (PF) 100 MCG/2ML IJ SOLN
INTRAMUSCULAR | Status: DC | PRN
  Administered 2020-08-10: 50 ug via INTRAVENOUS

## 2020-08-10 SURGICAL SUPPLY — 19 items
CANNULA ANT/CHMB 27G (MISCELLANEOUS) ×1 IMPLANT
CANNULA ANT/CHMB 27GA (MISCELLANEOUS) ×2 IMPLANT
GLOVE SURG ENC TEXT LTX SZ7.5 (GLOVE) ×2 IMPLANT
GLOVE SURG TRIUMPH 8.0 PF LTX (GLOVE) ×2 IMPLANT
GOWN STRL REUS W/ TWL LRG LVL3 (GOWN DISPOSABLE) ×2 IMPLANT
GOWN STRL REUS W/TWL LRG LVL3 (GOWN DISPOSABLE) ×4
LENS IOL MULTIFOCL 20.5 IMPLANT
LENS IOL MULTIFOCL TECNIS 20.5 ×2 IMPLANT
MARKER SKIN DUAL TIP RULER LAB (MISCELLANEOUS) ×2 IMPLANT
NDL CAPSULORHEX 25GA (NEEDLE) ×1 IMPLANT
NDL FILTER BLUNT 18X1 1/2 (NEEDLE) ×2 IMPLANT
NEEDLE CAPSULORHEX 25GA (NEEDLE) ×2 IMPLANT
NEEDLE FILTER BLUNT 18X 1/2SAF (NEEDLE) ×2
NEEDLE FILTER BLUNT 18X1 1/2 (NEEDLE) ×2 IMPLANT
PACK EYE AFTER SURG (MISCELLANEOUS) ×2 IMPLANT
SYR 3ML LL SCALE MARK (SYRINGE) ×4 IMPLANT
SYR TB 1ML LUER SLIP (SYRINGE) ×2 IMPLANT
WATER STERILE IRR 250ML POUR (IV SOLUTION) ×2 IMPLANT
WIPE NON LINTING 3.25X3.25 (MISCELLANEOUS) ×2 IMPLANT

## 2020-08-10 NOTE — H&P (Signed)
[facility]   Primary Care Physician:  Juluis Pitch, MD Ophthalmologist: Dr. Leandrew Koyanagi  Pre-Procedure History & Physical: HPI:  Shannon Weber is a 70 y.o. female here for ophthalmic surgery.   Past Medical History:  Diagnosis Date   Abnormal serum lipase level    Atrophic vaginitis 02/21/2016   Chocolate cyst of ovary    COVID-19 07/25/2020   "mild" -cough, sneeze, congestion, sore throat, headache.  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[place])    Onychomycosis    Varicose vein of leg     Past Surgical History:  Procedure Laterality Date   ABDOMINAL HYSTERECTOMY     BREAST BIOPSY Left 1980's   negative   BREAST CYST ASPIRATION Right 2008   negative   BREAST EXCISIONAL BIOPSY Left 1989   benign per pt   COLONOSCOPY  2014   COLONOSCOPY W/ POLYPECTOMY     EXTRACORPOREAL SHOCK WAVE LITHOTRIPSY Left 12/25/2014   Procedure: EXTRACORPOREAL SHOCK WAVE LITHOTRIPSY (ESWL);  Surgeon: Royston Cowper, MD;  Location: [facility] ORS;  Service: Urology;  Laterality: Left;   mole removed     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 Authorizing Provider  Cholecalciferol (VITAMIN D PO) Take by mouth daily. Reported on 04/10/2015   Yes [provider]  citalopram (CELEXA) 20 MG tablet TAKE 1 TABLET DAILY 06/15/18  Yes Poulose, Bethel Born, NP  Cyanocobalamin (B-12) 2500 MCG TABS Take 1 tablet by mouth daily.   Yes [provider]  estradiol (ESTRACE) 0.1 MG/GM vaginal cream PLACE 1 APPLICATORFULL VAGINALLY ONCE A WEEK (REDUCE FREQUENCY TO JUST ONCE A WEEK) 12/05/17  Yes Lada, Satira Anis, MD  Misc Natural Products (GLUCOSAMINE CHONDROITIN ADV PO) Take by mouth 2 (two) times daily. Reported on 04/10/2015   Yes [provider]  TURMERIC PO Take by mouth.   Yes [provider]    Allergies as of 07/10/2020 - Review Complete 05/18/2018  Allergen Reaction Noted   Levaquin [levofloxacin in d5w] Anxiety and Other (See Comments) 01/05/2015    Family History  Problem Relation Age of Onset   Goiter Mother    Cancer Father        colon/lung   Hyperlipidemia Father    Heart disease Father 67   Hypertension Father    Breast cancer Maternal Aunt    Breast cancer Paternal Aunt    Bipolar disorder Brother    Alzheimer's disease Maternal Grandfather    Heart disease Paternal Grandmother     Social History   Socioeconomic History   Marital status: Married    Spouse name: Fritz Pickerel   Number of children: 2   Years of education: Not on file   Highest education level: Bachelor's degree (e.g., BA, AB, BS)  Occupational History   Occupation: Retired  Tobacco Use   Smoking status: Never   Smokeless tobacco: Never   Tobacco comments:    smoking cessation materials not required  Vaping Use   Vaping Use: Never used  Substance and Sexual Activity   Alcohol use: No    Alcohol/week: 0.0 standard drinks   Drug use: No   Sexual activity: Not Currently  Other Topics Concern   Not on file  Social History Narrative  Not on file   Social Determinants of Health   Financial Resource Strain: Not on file  Food Insecurity: Not on file  Transportation Needs: Not on file  Physical Activity: Not on file  Stress: Not on file  Social Connections: Not on file  Intimate Partner Violence: Not on file    Review of Systems: See HPI, otherwise negative ROS  Physical Exam: BP 126/75   Pulse 82   Temp (!) 97.3 F (36.3 C) (Temporal)   Resp 20   Ht 5\' 5"  (1.651 m)   Wt 53.4 kg   SpO2 100%   BMI 19.59 kg/m  General:   Alert,  pleasant and cooperative in NAD Head:  Normocephalic and atraumatic. Lungs:  Clear to auscultation.    Heart:  Regular rate and rhythm.   Impression/Plan: Shannon Weber  Shannon Weber is here for ophthalmic surgery.  Risks, benefits, limitations, and alternatives regarding ophthalmic surgery have been reviewed with the patient.  Questions have been answered.  All parties agreeable.   Leandrew Koyanagi, MD  08/10/2020, 12:51 PM

## 2020-08-10 NOTE — Transfer of Care (Signed)
Immediate Anesthesia Transfer of Care Note  Patient: Shannon Weber  Procedure(s) Performed: CATARACT EXTRACTION PHACO AND INTRAOCULAR LENS PLACEMENT (IOC) RIGHT TECNIS LOW ADD 10.77 01:31.1 (Right: Eye)  Patient Location: PACU  Anesthesia Type: MAC  Level of Consciousness: awake, alert  and patient cooperative  Airway and Oxygen Therapy: Patient Spontanous Breathing and Patient connected to supplemental oxygen  Post-op Assessment: Post-op Vital signs reviewed, Patient's Cardiovascular Status Stable, Respiratory Function Stable, Patent Airway and No signs of Nausea or vomiting  Post-op Vital Signs: Reviewed and stable  Complications: No notable events documented.

## 2020-08-10 NOTE — Op Note (Signed)
  LOCATION:  [place]   PREOPERATIVE DIAGNOSIS:    Nuclear sclerotic cataract right eye. H25.11   POSTOPERATIVE DIAGNOSIS:  Nuclear sclerotic cataract right eye.     PROCEDURE:  Phacoemusification with posterior chamber intraocular lens placement of the right eye   ULTRASOUND TIME: Procedure(s): CATARACT EXTRACTION PHACO AND INTRAOCULAR LENS PLACEMENT (IOC) RIGHT TECNIS LOW ADD 10.77 01:31.1 (Right)  LENS:   Implant Name Type Inv. Item Serial No. Manufacturer Lot No. LRB No. Used Action  Tecnis 1 Multifocal IOL   [id number] [employer]  Right 1 Implanted      [id number] 20.5 Tecnis mutifocal with 2.75 Diopters of add power   SURGEON:  Wyonia Hough, MD   ANESTHESIA:  Topical with tetracaine drops and 2% Xylocaine jelly, augmented with 1% preservative-free intracameral lidocaine.    COMPLICATIONS:  None.   DESCRIPTION OF PROCEDURE:  The patient was identified in the holding room and transported to the operating room and placed in the supine position under the operating microscope.  The right eye was identified as the operative eye and it was prepped and draped in the usual sterile ophthalmic fashion.   A 1 millimeter clear-corneal paracentesis was made at the 12:00 position.  0.5 ml of preservative-free 1% lidocaine was injected into the anterior chamber. The anterior chamber was filled with Viscoat viscoelastic.  A 2.4 millimeter keratome was used to make a near-clear corneal incision at the 9:00 position.  A curvilinear capsulorrhexis was made with a cystotome and capsulorrhexis forceps.  Balanced salt solution was used to hydrodissect and hydrodelineate the nucleus.   Phacoemulsification was then used in stop and chop fashion to remove the lens nucleus and epinucleus.  The remaining cortex was then removed using the irrigation and aspiration handpiece. Provisc was then placed into the capsular bag to distend it for lens placement.  A lens was then injected into  the capsular bag.  The remaining viscoelastic was aspirated.   Wounds were hydrated with balanced salt solution.  The anterior chamber was inflated to a physiologic pressure with balanced salt solution.  No wound leaks were noted. Cefuroxime 0.1 ml of a 10mg /ml solution was injected into the anterior chamber for a dose of 1 mg of intracameral antibiotic at the completion of the case.   Timolol and Brimonidine drops were applied to the eye.  The patient was taken to the recovery room in stable condition without complications of anesthesia or surgery.   Avanni Turnbaugh 08/10/2020, 2:03 PM

## 2020-08-10 NOTE — Anesthesia Preprocedure Evaluation (Signed)
Anesthesia Evaluation    Airway Mallampati: II  TM Distance: >3 FB Neck ROM: Full    Dental no notable dental hx.    Pulmonary  Covid recovered (+07/27/20)   Pulmonary exam normal breath sounds clear to auscultation       Cardiovascular Normal cardiovascular exam Rhythm:Regular Rate:Normal  HLD   Neuro/Psych Depression    GI/Hepatic GERD  ,  Endo/Other    Renal/GU      Musculoskeletal   Abdominal   Peds  Hematology   Anesthesia Other Findings   Reproductive/Obstetrics                             Anesthesia Physical Anesthesia Plan  ASA: 2  Anesthesia Plan: MAC   Post-op Pain Management:    Induction: Intravenous  PONV Risk Score and Plan: 2 and Treatment may vary due to age or medical condition  Airway Management Planned: Natural Airway  Additional Equipment:   Intra-op Plan:   Post-operative Plan: Extubation in OR  Informed Consent: I have reviewed the patients History and Physical, chart, labs and discussed the procedure including the risks, benefits and alternatives for the proposed anesthesia with the patient or authorized representative who has indicated his/her understanding and acceptance.     Dental advisory given  Plan Discussed with: CRNA  Anesthesia Plan Comments:         Anesthesia Quick Evaluation

## 2020-08-10 NOTE — Anesthesia Postprocedure Evaluation (Signed)
Anesthesia Post Note  Patient: Shannon Weber  Procedure(s) Performed: CATARACT EXTRACTION PHACO AND INTRAOCULAR LENS PLACEMENT (IOC) RIGHT TECNIS LOW ADD 10.77 01:31.1 (Right: Eye)     Patient location during evaluation: PACU Anesthesia Type: MAC Level of consciousness: awake and alert Pain management: pain level controlled Vital Signs Assessment: post-procedure vital signs reviewed and stable Respiratory status: spontaneous breathing, nonlabored ventilation and respiratory function stable Cardiovascular status: stable and blood pressure returned to baseline Postop Assessment: no apparent nausea or vomiting Anesthetic complications: no   No notable events documented.  [name]

## 2020-08-10 NOTE — Anesthesia Procedure Notes (Signed)
Procedure Name: MAC Date/Time: 08/10/2020 1:44 PM Performed by: Cameron Ali, CRNA Pre-anesthesia Checklist: Patient identified, Emergency Drugs available, Suction available, Timeout performed and Patient being monitored Patient Re-evaluated:Patient Re-evaluated prior to induction Oxygen Delivery Method: Nasal cannula Placement Confirmation: positive ETCO2

## 2020-08-11 ENCOUNTER — Other Ambulatory Visit: Payer: Self-pay

## 2020-08-11 ENCOUNTER — Encounter: Payer: Self-pay | Admitting: Ophthalmology

## 2020-08-25 NOTE — Anesthesia Preprocedure Evaluation (Addendum)
Anesthesia Evaluation  Patient identified by MRN, date of birth, ID band Patient awake    Reviewed: Allergy & Precautions, NPO status , Patient's Chart, lab work & pertinent test results  History of Anesthesia Complications Negative for: history of anesthetic complications  Airway Mallampati: I   Neck ROM: Full    Dental no notable dental hx.    Pulmonary  COVID+ 07/25/20, no residual symptoms   Pulmonary exam normal breath sounds clear to auscultation       Cardiovascular Exercise Tolerance: Good negative cardio ROS Normal cardiovascular exam Rhythm:Regular Rate:Normal     Neuro/Psych PSYCHIATRIC DISORDERS Depression negative neurological ROS     GI/Hepatic GERD  ,  Endo/Other  negative endocrine ROS  Renal/GU Renal disease (nephrolithiasis)     Musculoskeletal   Abdominal   Peds  Hematology negative hematology ROS (+)   Anesthesia Other Findings   Reproductive/Obstetrics                            Anesthesia Physical Anesthesia Plan  ASA: 2  Anesthesia Plan: MAC   Post-op Pain Management:    Induction: Intravenous  PONV Risk Score and Plan: 2 and TIVA, Midazolam and Treatment may vary due to age or medical condition  Airway Management Planned: Nasal Cannula  Additional Equipment:   Intra-op Plan:   Post-operative Plan:   Informed Consent: I have reviewed the patients History and Physical, chart, labs and discussed the procedure including the risks, benefits and alternatives for the proposed anesthesia with the patient or authorized representative who has indicated his/her understanding and acceptance.       Plan Discussed with: CRNA  Anesthesia Plan Comments:        Anesthesia Quick Evaluation

## 2020-08-26 ENCOUNTER — Ambulatory Visit: Payer: Medicare HMO | Admitting: Anesthesiology

## 2020-08-26 ENCOUNTER — Ambulatory Visit
Admission: RE | Admit: 2020-08-26 | Discharge: 2020-08-26 | Disposition: A | Discharge status: Home / Self Care | Payer: Medicare HMO | Attending: Ophthalmology | Admitting: Ophthalmology

## 2020-08-26 ENCOUNTER — Encounter
Admission: RE | Disposition: A | Discharge status: Home / Self Care | Payer: Self-pay | Source: Home / Self Care | Attending: Ophthalmology

## 2020-08-26 ENCOUNTER — Encounter: Payer: Self-pay | Admitting: Ophthalmology

## 2020-08-26 ENCOUNTER — Other Ambulatory Visit: Payer: Self-pay

## 2020-08-26 DIAGNOSIS — Z79899 Other long term (current) drug therapy: Secondary | ICD-10-CM | POA: Insufficient documentation

## 2020-08-26 DIAGNOSIS — Z8616 Personal history of COVID-19: Secondary | ICD-10-CM | POA: Insufficient documentation

## 2020-08-26 DIAGNOSIS — Z881 Allergy status to other antibiotic agents status: Secondary | ICD-10-CM | POA: Insufficient documentation

## 2020-08-26 DIAGNOSIS — H2512 Age-related nuclear cataract, left eye: Secondary | ICD-10-CM | POA: Insufficient documentation

## 2020-08-26 DIAGNOSIS — Z96641 Presence of right artificial hip joint: Secondary | ICD-10-CM | POA: Insufficient documentation

## 2020-08-26 HISTORY — PX: CATARACT EXTRACTION W/PHACO: SHX586

## 2020-08-26 SURGERY — PHACOEMULSIFICATION, CATARACT, WITH IOL INSERTION
Anesthesia: Monitor Anesthesia Care | Site: Eye | Laterality: Left

## 2020-08-26 MED ORDER — FENTANYL CITRATE (PF) 100 MCG/2ML IJ SOLN
INTRAMUSCULAR | Status: DC | PRN
  Administered 2020-08-26 (×2): 25 ug via INTRAVENOUS
  Administered 2020-08-26: 50 ug via INTRAVENOUS

## 2020-08-26 MED ORDER — ONDANSETRON HCL 4 MG/2ML IJ SOLN: 4.0000 mg | Freq: Once | INTRAMUSCULAR | Status: DC | PRN

## 2020-08-26 MED ORDER — CEFUROXIME OPHTHALMIC INJECTION 1 MG/0.1 ML
INJECTION | OPHTHALMIC | Status: DC | PRN
  Administered 2020-08-26: 0.1 mL via INTRACAMERAL

## 2020-08-26 MED ORDER — ACETAMINOPHEN 160 MG/5ML PO SOLN: 325.0000 mg | ORAL | Status: DC | PRN

## 2020-08-26 MED ORDER — MIDAZOLAM HCL 2 MG/2ML IJ SOLN
INTRAMUSCULAR | Status: DC | PRN
  Administered 2020-08-26 (×2): 1 mg via INTRAVENOUS

## 2020-08-26 MED ORDER — PHENYLEPHRINE HCL 10 % OP SOLN
1.0000 [drp] | OPHTHALMIC | Status: DC | PRN
  Administered 2020-08-26 (×3): 1 [drp] via OPHTHALMIC

## 2020-08-26 MED ORDER — LACTATED RINGERS IV SOLN: INTRAVENOUS | Status: DC

## 2020-08-26 MED ORDER — CYCLOPENTOLATE HCL 2 % OP SOLN
1.0000 [drp] | OPHTHALMIC | Status: DC | PRN
  Administered 2020-08-26 (×3): 1 [drp] via OPHTHALMIC

## 2020-08-26 MED ORDER — BRIMONIDINE TARTRATE-TIMOLOL 0.2-0.5 % OP SOLN
OPHTHALMIC | Status: DC | PRN
  Administered 2020-08-26: 1 [drp] via OPHTHALMIC

## 2020-08-26 MED ORDER — SIGHTPATH DOSE#1 NA HYALUR & NA CHOND-NA HYALUR IO KIT
PACK | INTRAOCULAR | Status: DC | PRN
  Administered 2020-08-26: 1 via OPHTHALMIC

## 2020-08-26 MED ORDER — LIDOCAINE HCL (PF) 2 % IJ SOLN
INTRAOCULAR | Status: DC | PRN
  Administered 2020-08-26: 1 mL

## 2020-08-26 MED ORDER — TETRACAINE HCL 0.5 % OP SOLN
1.0000 [drp] | OPHTHALMIC | Status: DC | PRN
  Administered 2020-08-26 (×3): 1 [drp] via OPHTHALMIC

## 2020-08-26 MED ORDER — SIGHTPATH DOSE#1 BSS IO SOLN
INTRAOCULAR | Status: DC | PRN
  Administered 2020-08-26: 66 mL via OPHTHALMIC

## 2020-08-26 MED ORDER — ACETAMINOPHEN 325 MG PO TABS: 650.0000 mg | ORAL_TABLET | Freq: Once | ORAL | Status: DC | PRN

## 2020-08-26 SURGICAL SUPPLY — 19 items
CANNULA ANT/CHMB 27G (MISCELLANEOUS) ×1 IMPLANT
CANNULA ANT/CHMB 27GA (MISCELLANEOUS) ×2 IMPLANT
GLOVE SURG ENC TEXT LTX SZ7.5 (GLOVE) ×2 IMPLANT
GLOVE SURG TRIUMPH 8.0 PF LTX (GLOVE) ×2 IMPLANT
GOWN STRL REUS W/ TWL LRG LVL3 (GOWN DISPOSABLE) ×2 IMPLANT
GOWN STRL REUS W/TWL LRG LVL3 (GOWN DISPOSABLE) ×4
LENS IOL TECNIS MF TRC 21.5 ×1 IMPLANT
LENS MULTIFOCAL TORIC II IOL ×2 IMPLANT
MARKER SKIN DUAL TIP RULER LAB (MISCELLANEOUS) ×2 IMPLANT
NDL CAPSULORHEX 25GA (NEEDLE) ×1 IMPLANT
NDL FILTER BLUNT 18X1 1/2 (NEEDLE) ×2 IMPLANT
NEEDLE CAPSULORHEX 25GA (NEEDLE) ×2 IMPLANT
NEEDLE FILTER BLUNT 18X 1/2SAF (NEEDLE) ×2
NEEDLE FILTER BLUNT 18X1 1/2 (NEEDLE) ×2 IMPLANT
PACK EYE AFTER SURG (MISCELLANEOUS) ×2 IMPLANT
SYR 3ML LL SCALE MARK (SYRINGE) ×4 IMPLANT
SYR TB 1ML LUER SLIP (SYRINGE) ×2 IMPLANT
WATER STERILE IRR 250ML POUR (IV SOLUTION) ×2 IMPLANT
WIPE NON LINTING 3.25X3.25 (MISCELLANEOUS) ×2 IMPLANT

## 2020-08-26 NOTE — Anesthesia Postprocedure Evaluation (Signed)
Anesthesia Post Note  Patient: Shannon Weber  Procedure(s) Performed: CATARACT EXTRACTION PHACO AND INTRAOCULAR LENS PLACEMENT (IOC) LEFT TECNIS TORIC LOW ADD 9.98 01:22.9 (Left: Eye)     Patient location during evaluation: PACU Anesthesia Type: MAC Level of consciousness: awake and alert, oriented and patient cooperative Pain management: pain level controlled Vital Signs Assessment: post-procedure vital signs reviewed and stable Respiratory status: spontaneous breathing, nonlabored ventilation and respiratory function stable Cardiovascular status: blood pressure returned to baseline and stable Postop Assessment: adequate PO intake Anesthetic complications: no   No notable events documented.  Darrin Nipper

## 2020-08-26 NOTE — H&P (Signed)
[facility]   Primary Care Physician:  Juluis Pitch, MD Ophthalmologist: Dr. Leandrew Koyanagi  Pre-Procedure History & Physical: HPI:  Shannon Weber is a 70 y.o. female here for ophthalmic surgery.   Past Medical History:  Diagnosis Date   Abnormal serum lipase level    Atrophic vaginitis 02/21/2016   Chocolate cyst of ovary    COVID-19 07/25/2020   "mild" -cough, sneeze, congestion, sore throat, headache.  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[place])    Onychomycosis    Varicose vein of leg     Past Surgical History:  Procedure Laterality Date   ABDOMINAL HYSTERECTOMY     BREAST BIOPSY Left 1980's   negative   BREAST CYST ASPIRATION Right 2008   negative   BREAST EXCISIONAL BIOPSY Left 1989   benign per pt   COLONOSCOPY  2014   COLONOSCOPY W/ POLYPECTOMY     EXTRACORPOREAL SHOCK WAVE LITHOTRIPSY Left 12/25/2014   Procedure: EXTRACORPOREAL SHOCK WAVE LITHOTRIPSY (ESWL);  Surgeon: Royston Cowper, MD;  Location: [facility];  Service: Urology;  Laterality: Left;   mole removed     REPLACEMENT TOTAL HIP W/  RESURFACING IMPLANTS Right 04/13/2015   TOTAL HIP ARTHROPLASTY Right [id number]    Prior to Admission medications   Medication Sig Start Date End Date Taking? Authorizing Provider  Cholecalciferol (VITAMIN D PO) Take by mouth daily. Reported on 04/10/2015   Yes [provider]  citalopram (CELEXA) 20 MG tablet TAKE 1 TABLET DAILY 06/15/18  Yes Poulose, Bethel Born, NP  Cyanocobalamin (B-12) 2500 MCG TABS Take 1 tablet by mouth daily.   Yes [provider]  estradiol (ESTRACE) 0.1 MG/GM vaginal cream PLACE 1 APPLICATORFULL VAGINALLY ONCE A WEEK (REDUCE FREQUENCY TO JUST ONCE A WEEK) 12/05/17  Yes Lada, Satira Anis, MD  Misc Natural Products (GLUCOSAMINE CHONDROITIN ADV PO) Take by mouth 2 (two) times daily. Reported on 04/10/2015   Yes [provider]  TURMERIC PO Take by mouth.   Yes [provider]    Allergies as of 07/10/2020 - Review Complete 05/18/2018  Allergen Reaction Noted   Levaquin [levofloxacin in d5w] Anxiety and Other (See Comments) 01/05/2015    Family History  Problem Relation Age of Onset   Goiter Mother    Cancer Father        colon/lung   Hyperlipidemia Father    Heart disease Father 27   Hypertension Father    Breast cancer Maternal Aunt    Breast cancer Paternal Aunt    Bipolar disorder Brother    Alzheimer's disease Maternal Grandfather    Heart disease Paternal Grandmother     Social History   Socioeconomic History   Marital status: Married    Spouse name: Fritz Pickerel   Number of children: 2   Years of education: Not on file   Highest education level: Bachelor's degree (e.g., BA, AB, BS)  Occupational History   Occupation: Retired  Tobacco Use   Smoking status: Never   Smokeless tobacco: Never   Tobacco comments:    smoking cessation materials not required  Vaping Use   Vaping Use: Never used  Substance and Sexual Activity   Alcohol use: No    Alcohol/week: 0.0 standard drinks   Drug use: No   Sexual activity: Not Currently  Other Topics Concern   Not on file  Social History Narrative  Not on file   Social Determinants of Health   Financial Resource Strain: Not on file  Food Insecurity: Not on file  Transportation Needs: Not on file  Physical Activity: Not on file  Stress: Not on file  Social Connections: Not on file  Intimate Partner Violence: Not on file    Review of Systems: See HPI, otherwise negative ROS  Physical Exam: BP 130/76   Pulse 71   Temp (!) 97.3 F (36.3 C) (Temporal)   Resp 16   Wt 53.5 kg   SpO2 100%   BMI 19.64 kg/m  General:   Alert,  pleasant and cooperative in NAD Head:  Normocephalic and atraumatic. Lungs:  Clear to auscultation.    Heart:  Regular rate and rhythm.   Impression/Plan: Shannon Weber is here for  ophthalmic surgery.  Risks, benefits, limitations, and alternatives regarding ophthalmic surgery have been reviewed with the patient.  Questions have been answered.  All parties agreeable.   Leandrew Koyanagi, MD  08/26/2020, 8:49 AM

## 2020-08-26 NOTE — Transfer of Care (Signed)
Immediate Anesthesia Transfer of Care Note  Patient: Shannon Weber  Procedure(s) Performed: CATARACT EXTRACTION PHACO AND INTRAOCULAR LENS PLACEMENT (IOC) LEFT TECNIS TORIC LOW ADD 9.98 01:22.9 (Left: Eye)  Patient Location: PACU  Anesthesia Type: MAC  Level of Consciousness: awake, alert  and patient cooperative  Airway and Oxygen Therapy: Patient Spontanous Breathing and Patient connected to supplemental oxygen  Post-op Assessment: Post-op Vital signs reviewed, Patient's Cardiovascular Status Stable, Respiratory Function Stable, Patent Airway and No signs of Nausea or vomiting  Post-op Vital Signs: Reviewed and stable  Complications: No notable events documented.

## 2020-08-26 NOTE — Anesthesia Procedure Notes (Signed)
Procedure Name: MAC Date/Time: 08/26/2020 9:25 AM Performed by: Jeannene Patella, CRNA Pre-anesthesia Checklist: Patient identified, Emergency Drugs available, Suction available, Timeout performed and Patient being monitored Patient Re-evaluated:Patient Re-evaluated prior to induction Oxygen Delivery Method: Nasal cannula Placement Confirmation: positive ETCO2

## 2020-08-26 NOTE — Op Note (Signed)
  LOCATION:  [place]   PREOPERATIVE DIAGNOSIS:  Nuclear sclerotic cataract of the left eye.  H25.12  POSTOPERATIVE DIAGNOSIS:  Nuclear sclerotic cataract of the left eye.   PROCEDURE:  Phacoemulsification with Toric posterior chamber intraocular lens placement of the left eye.  Ultrasound time: Procedure(s): CATARACT EXTRACTION PHACO AND INTRAOCULAR LENS PLACEMENT (IOC) LEFT TECNIS TORIC LOW ADD 9.98 01:22.9 (Left)  LENS:  [id number] 21.5 D Tecnis Toric intraocular lens with 1.5 diopters of cylindrical power with axis orientation at 10 degrees.     SURGEON:  Wyonia Hough, MD   ANESTHESIA:  Topical with tetracaine drops and 2% Xylocaine jelly, augmented with 1% preservative-free intracameral lidocaine.  COMPLICATIONS:  None.   DESCRIPTION OF PROCEDURE:  The patient was identified in the holding room and transported to the operating suite and placed in the supine position under the operating microscope.  The left eye was identified as the operative eye, and it was prepped and draped in the usual sterile ophthalmic fashion.    A clear-corneal paracentesis incision was made at the 1:30 position.  0.5 ml of preservative-free 1% lidocaine was injected into the anterior chamber. The anterior chamber was filled with Viscoat.  A 2.4 millimeter near clear corneal incision was then made at the 10:30 position.  A cystotome and capsulorrhexis forceps were then used to make a curvilinear capsulorrhexis.  Hydrodissection and hydrodelineation were then performed using balanced salt solution.   Phacoemulsification was then used in stop and chop fashion to remove the lens, nucleus and epinucleus.  The remaining cortex was aspirated using the irrigation and aspiration handpiece.  Provisc viscoelastic was then placed into the capsular bag to distend it for lens placement.  The Verion digital marker was used to align the implant at the intended axis.   A Toric lens was then injected into  the capsular bag.  It was rotated clockwise until the axis marks on the lens were approximately 15 degrees in the counterclockwise direction to the intended alignment.  The viscoelastic was aspirated from the eye using the irrigation aspiration handpiece.  Then, a Koch spatula through the sideport incision was used to rotate the lens in a clockwise direction until the axis markings of the intraocular lens were lined up with the Verion alignment.  Balanced salt solution was then used to hydrate the wounds. Cefuroxime 0.1 ml of a 10mg /ml solution was injected into the anterior chamber for a dose of 1 mg of intracameral antibiotic at the completion of the case.    The eye was noted to have a physiologic pressure and there was no wound leak noted.   Timolol and Brimonidine drops were applied to the eye.  The patient was taken to the recovery room in stable condition having had no complications of anesthesia or surgery.  Shannon Weber 08/26/2020, 9:50 AM

## 2020-08-31 ENCOUNTER — Encounter: Payer: Self-pay | Admitting: Ophthalmology

## 2020-09-01 ENCOUNTER — Encounter: Payer: Self-pay | Admitting: Ophthalmology

## 2020-10-07 ENCOUNTER — Other Ambulatory Visit: Payer: Self-pay

## 2020-10-07 ENCOUNTER — Ambulatory Visit (INDEPENDENT_AMBULATORY_CARE_PROVIDER_SITE_OTHER): Payer: Medicare HMO

## 2020-10-07 ENCOUNTER — Encounter: Payer: Self-pay | Admitting: Podiatry

## 2020-10-07 ENCOUNTER — Ambulatory Visit: Payer: Medicare HMO | Admitting: Podiatry

## 2020-10-07 ENCOUNTER — Other Ambulatory Visit: Payer: Self-pay | Admitting: Podiatry

## 2020-10-07 DIAGNOSIS — M2011 Hallux valgus (acquired), right foot: Secondary | ICD-10-CM

## 2020-10-07 DIAGNOSIS — M7752 Other enthesopathy of left foot: Secondary | ICD-10-CM

## 2020-10-07 DIAGNOSIS — D2371 Other benign neoplasm of skin of right lower limb, including hip: Secondary | ICD-10-CM

## 2020-10-07 DIAGNOSIS — L603 Nail dystrophy: Secondary | ICD-10-CM

## 2020-10-07 DIAGNOSIS — M778 Other enthesopathies, not elsewhere classified: Secondary | ICD-10-CM

## 2020-10-07 MED ORDER — DEXAMETHASONE SODIUM PHOSPHATE 120 MG/30ML IJ SOLN
2.0000 mg | Freq: Once | INTRAMUSCULAR | Status: AC
Start: 2020-10-07 — End: 2020-10-07
  Administered 2020-10-07: 2 mg via INTRA_ARTICULAR

## 2020-10-07 NOTE — Progress Notes (Signed)
Subjective:  Patient ID: Shannon Weber, female    DOB: [birth year],  MRN: [id number] HPI Chief Complaint  Patient presents with   Foot Pain    5th MPJ left - aching x 3 weeks, no injury   Foot Pain    1st MPJ right - bunion deformity x years, callused area plantar forefoot, tender   Nail Problem    3rd, 4th, 5th right -thick, discolored nail, took lamisil, but was allergic   New Patient (Initial Visit)    70 y.o. female presents with the above complaint.   ROS: Denies fever chills nausea vomiting muscle aches pains calf pain back pain chest pain shortness of breath.  Past Medical History:  Diagnosis Date   Abnormal serum lipase level    Atrophic vaginitis 02/21/2016   Chocolate cyst of ovary    COVID-19 07/25/2020   "mild" -cough, sneeze, congestion, sore throat, headache.  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[place])    Onychomycosis    Varicose vein of leg    Past Surgical History:  Procedure Laterality Date   ABDOMINAL HYSTERECTOMY     BREAST BIOPSY Left 1980's   negative   BREAST CYST ASPIRATION Right 2008   negative   BREAST EXCISIONAL BIOPSY Left 1989   benign per pt   CATARACT EXTRACTION W/PHACO Left 08/26/2020   Procedure: CATARACT EXTRACTION PHACO AND INTRAOCULAR LENS PLACEMENT ([place]) LEFT TECNIS TORIC LOW ADD 9.98 01:22.9;  Surgeon: Leandrew Koyanagi, MD;  Location: [place];  Service: Ophthalmology;  Laterality: Left;   CATARACT EXTRACTION W/PHACO Right 08/10/2020   Procedure: CATARACT EXTRACTION PHACO AND INTRAOCULAR LENS PLACEMENT (IOC) RIGHT TECNIS LOW ADD 10.77 01:31.1;  Surgeon: Leandrew Koyanagi, MD;  Location: [place];  Service: Ophthalmology;  Laterality: Right;   COLONOSCOPY  2014   COLONOSCOPY W/ POLYPECTOMY     EXTRACORPOREAL SHOCK WAVE LITHOTRIPSY Left 12/25/2014   Procedure: EXTRACORPOREAL SHOCK WAVE LITHOTRIPSY (ESWL);   Surgeon: Royston Cowper, MD;  Location: [facility];  Service: Urology;  Laterality: Left;   mole removed     REPLACEMENT TOTAL HIP W/  RESURFACING IMPLANTS Right 04/13/2015   TOTAL HIP ARTHROPLASTY Right [id number]    Current Outpatient Medications:    Cholecalciferol (VITAMIN D PO), Take by mouth daily. Reported on 04/10/2015, Disp: , Rfl:    citalopram (CELEXA) 20 MG tablet, TAKE 1 TABLET DAILY, Disp: 90 tablet, Rfl: 1   Cyanocobalamin (B-12) 2500 MCG TABS, Take 1 tablet by mouth daily., Disp: , Rfl:    estradiol (ESTRACE) 0.1 MG/GM vaginal cream, PLACE 1 APPLICATORFULL VAGINALLY ONCE A WEEK (REDUCE FREQUENCY TO JUST ONCE A WEEK), Disp: 42.5 g, Rfl: 0   Misc Natural Products (GLUCOSAMINE CHONDROITIN ADV PO), Take by mouth 2 (two) times daily. Reported on 04/10/2015, Disp: , Rfl:    TURMERIC PO, Take by mouth., Disp: , Rfl:   Allergies  Allergen Reactions   Lamisil [Terbinafine] Itching   Levaquin [Levofloxacin In D5w] Anxiety and Other (See Comments)    Had anxiety, shakey, "not herself", hallucinations   Review of Systems Objective:  There were no vitals filed for this visit.  General: Well developed, nourished, in no acute distress, alert and oriented x3   Dermatological: Skin is warm, dry and supple bilateral. Nails x 10 are well maintained; remaining integument appears unremarkable at this time. There are no open sores, no preulcerative lesions, no  rash or signs of infection present.  Benign skin lesion reactive hyper keratoma beneath the third metatarsal head of the right foot.  Toenails #3 #4 #5 the right foot demonstrate a nail dystrophy cannot rule out onychomycosis.  Vascular: Dorsalis Pedis artery and Posterior Tibial artery pedal pulses are 2/4 bilateral with immedate capillary fill time. Pedal hair growth present. No varicosities and no lower extremity edema present bilateral.   Neruologic: Grossly intact via light touch bilateral. Vibratory intact via tuning fork bilateral.  Protective threshold with Semmes Wienstein monofilament intact to all pedal sites bilateral. Patellar and Achilles deep tendon reflexes 2+ bilateral. No Babinski or clonus noted bilateral.   Musculoskeletal: No gross boney pedal deformities bilateral. No pain, crepitus, or limitation noted with foot and ankle range of motion bilateral. Muscular strength 5/5 in all groups tested bilateral.  Pain on palpation of the fifth metatarsophalangeal joint left foot with palpable bursa.  Mild hallux abductovalgus deformity of the right foot plantarflexed third metatarsal resulting in reactive hyper keratoma.  Gait: Unassisted, Nonantalgic.    Radiographs:  Radiographs taken today demonstrate an osseously mature individual mild hallux valgus mild tailor's bunion deformity mild osteopenia no acute findings  Assessment & Plan:   Assessment: Bursitis capsulitis fifth metatarsophalangeal joint left foot.  Nail dystrophy lesser digits right foot.  Bunion deformity with plantarflexed third metatarsal and reactive hyperkeratosis.  Plan: Sample of the toenails today 3 through 5 right follow-up with her in 1 month for that.  I also injected the fifth met phalangeal joint for the bursitis with 2 mg of dexamethasone tolerated procedure well.  I also debrided the benign skin lesion plantar aspect third metatarsal head right foot.     Alexismarie Flaim T. [place], [place]

## 2020-10-13 ENCOUNTER — Encounter: Payer: Self-pay | Admitting: Podiatry

## 2020-11-09 ENCOUNTER — Encounter: Payer: Self-pay | Admitting: Podiatry

## 2020-11-09 ENCOUNTER — Telehealth: Payer: Self-pay | Admitting: *Deleted

## 2020-11-09 ENCOUNTER — Other Ambulatory Visit: Payer: Self-pay

## 2020-11-09 ENCOUNTER — Ambulatory Visit: Payer: Medicare HMO | Admitting: Podiatry

## 2020-11-09 DIAGNOSIS — Z79899 Other long term (current) drug therapy: Secondary | ICD-10-CM | POA: Diagnosis not present

## 2020-11-09 DIAGNOSIS — M7752 Other enthesopathy of left foot: Secondary | ICD-10-CM

## 2020-11-09 DIAGNOSIS — L603 Nail dystrophy: Secondary | ICD-10-CM

## 2020-11-09 MED ORDER — ITRACONAZOLE 100 MG PO CAPS: ORAL_CAPSULE | ORAL | 0 refills | Status: DC

## 2020-11-09 MED ORDER — DEXAMETHASONE SODIUM PHOSPHATE 120 MG/30ML IJ SOLN
2.0000 mg | Freq: Once | INTRAMUSCULAR | Status: AC
  Administered 2020-11-09: 2 mg via INTRA_ARTICULAR

## 2020-11-09 NOTE — Progress Notes (Signed)
She presents today for follow-up of her capsulitis and her nail samples.  States that the metatarsalgia capsulitis fifth metatarsophalangeal joint is doing much better.  Objective: Vital signs stable alert oriented x3 there is no erythema edema so strange odor she does have some mild tenderness on palpation of the fifth metatarsal head of the left foot.  Most likely associate with neuritis bursitis.  Pathology did demonstrate onychomycosis.  But she is allergic to tramadol.  Assessment: Onychomycosis.  Bursitis improving.  Plan: Reinjected dexamethasone local anesthetic today fifth metatarsophalangeal joint area.  She tolerated procedure well without complications we are requesting a CMP so that we may start the itraconazole.  We are going to do this in a pulsed dose should she have questions or concerns or develop any rashes or itching she will notify us immediately.  Otherwise I will follow-up with her in 1 month

## 2020-11-09 NOTE — Addendum Note (Signed)
Addended by: Rip Harbour on: 11/09/2020 05:16 PM   Modules accepted: Orders

## 2020-11-09 NOTE — Telephone Encounter (Signed)
Rx sent to [facility] [address] 

## 2020-11-09 NOTE — Telephone Encounter (Signed)
"  I was there today.  I asked that my prescription be sent to [facility] on [address]  It was sent to [facility].  Can you cancel that order at [facility] and send it to [facility]?"  I will send a message to his nurse.

## 2020-11-10 LAB — COMPREHENSIVE METABOLIC PANEL
ALT: 11 IU/L (ref 0–32)
AST: 17 IU/L (ref 0–40)
Albumin/Globulin Ratio: 1.9 (ref 1.2–2.2)
Albumin: 4.4 g/dL (ref 3.7–4.7)
Alkaline Phosphatase: 55 IU/L (ref 44–121)
BUN/Creatinine Ratio: 37 — ABNORMAL HIGH (ref 12–28)
BUN: 23 mg/dL (ref 8–27)
Bilirubin Total: 0.3 mg/dL (ref 0.0–1.2)
CO2: 26 mmol/L (ref 20–29)
Calcium: 9.9 mg/dL (ref 8.7–10.3)
Chloride: 104 mmol/L (ref 96–106)
Creatinine, Ser: 0.63 mg/dL (ref 0.57–1.00)
Globulin, Total: 2.3 g/dL (ref 1.5–4.5)
Glucose: 76 mg/dL (ref 70–99)
Potassium: 4.6 mmol/L (ref 3.5–5.2)
Sodium: 143 mmol/L (ref 134–144)
Total Protein: 6.7 g/dL (ref 6.0–8.5)
eGFR: 94 mL/min/{1.73_m2} (ref >59)

## 2020-11-11 ENCOUNTER — Telehealth: Payer: Self-pay | Admitting: *Deleted

## 2020-11-11 NOTE — Telephone Encounter (Signed)
"  I saw Dr. Milinda Pointer on Monday.  He had me go to [facility] for bloodwork.  When the results came back, I have on level that was really high above the normal.  I was concerned about what that meant.  It's Bun Creatinine Ratio.  I would like for somebody to be in touch with me about that."

## 2020-11-12 NOTE — Telephone Encounter (Signed)
I'm returning your wife's call.  Dr. Milinda Pointer said to let her know that it is really nothing to worry about since the BUN and creatinine are within normal limits.  "Even though it says high on the report?"  He said it's within normal range.

## 2020-11-25 ENCOUNTER — Telehealth: Payer: Self-pay | Admitting: *Deleted

## 2020-11-25 NOTE — Telephone Encounter (Signed)
"  I'm calling, I have been taking Itraconazole Tablets / Capsules.  It's making me very dizzy and light headed.  I'm just going to stop taking that medication.  Dr. Milinda Pointer prescribed it.  Would you please let him know that.  I'm supposed to have an appointment later this month.  I'm assuming that I will still keep that appointment.  I need to know if that is true or not."  "I was on the phone.  I saw that I had you calling.  I tried to switch over.  I got disconnected and haven't been able to get you back.  If you would, give me a call once again."  I advised the patient to stop taking the medication and to keep her scheduled follow-up appointment.

## 2020-12-09 ENCOUNTER — Other Ambulatory Visit: Payer: Self-pay

## 2020-12-09 ENCOUNTER — Ambulatory Visit: Payer: Medicare HMO | Admitting: Podiatry

## 2020-12-09 ENCOUNTER — Encounter: Payer: Self-pay | Admitting: Podiatry

## 2020-12-09 DIAGNOSIS — L603 Nail dystrophy: Secondary | ICD-10-CM | POA: Diagnosis not present

## 2020-12-09 NOTE — Progress Notes (Signed)
She presents today for follow-up of her nail fungus.  States that she was only able to take about a week or so of her itraconazole because it started to make her dizzy.  So she discontinued the medication.  She has not been dizzy since.  Objective: Vital signs are stable is alert and oriented x3.  No change in physical exam.  Assessment: Pain limb secondary onychomycosis discontinue itraconazole because of dizziness.  Unable to use terbinafine because of rashes and itching.  Plan: Discussed etiology pathology and surgical therapies.  We discussed alternatives to oral therapies today consisting of topical therapy which she does not want to do and laser therapy.  She will notify us when she has made a decision.  If she would like to do topical therapy then Jublia will be the drug of choice otherwise laser therapy would take place in [place]

## 2021-05-06 ENCOUNTER — Other Ambulatory Visit: Payer: Self-pay | Admitting: Family Medicine

## 2021-05-06 DIAGNOSIS — Z1231 Encounter for screening mammogram for malignant neoplasm of breast: Secondary | ICD-10-CM

## 2021-05-10 ENCOUNTER — Encounter: Payer: Self-pay | Admitting: Podiatry

## 2021-05-10 ENCOUNTER — Ambulatory Visit: Payer: Medicare HMO | Admitting: Podiatry

## 2021-05-10 ENCOUNTER — Other Ambulatory Visit: Payer: Self-pay

## 2021-05-10 DIAGNOSIS — D2371 Other benign neoplasm of skin of right lower limb, including hip: Secondary | ICD-10-CM | POA: Diagnosis not present

## 2021-05-10 NOTE — Progress Notes (Signed)
She presents today chief complaint of a painful callus plantar aspect of the forefoot right. ? ?Objective: Benign skin lesion plantar aspect forefoot right side of the third metatarsal head no open lesions or wounds.  Pulses remain palpable. ? ?Assessment: Painful benign skin lesion. ? ?Plan: Debrided reactive hyperkeratotic tissue ?

## 2021-05-14 ENCOUNTER — Other Ambulatory Visit: Payer: Medicare HMO

## 2021-05-31 ENCOUNTER — Other Ambulatory Visit: Payer: Self-pay

## 2021-06-02 ENCOUNTER — Ambulatory Visit (INDEPENDENT_AMBULATORY_CARE_PROVIDER_SITE_OTHER): Payer: Self-pay

## 2021-06-02 DIAGNOSIS — B351 Tinea unguium: Secondary | ICD-10-CM

## 2021-06-02 DIAGNOSIS — L603 Nail dystrophy: Secondary | ICD-10-CM

## 2021-06-02 NOTE — Progress Notes (Signed)
Patient presents today for the 1st laser treatment. Diagnosed with mycotic nail infection by Dr. Milinda Pointer.  ? ?Toenail most affected right 3rd. ? ?All other systems are negative. ? ?Nails were filed thin. Laser therapy was administered to right 1-5 toenails  and patient tolerated the treatment well. All safety precautions were in place.  ? ? ?Follow up in 4 weeks for laser # 2. ? ?

## 2021-06-02 NOTE — Patient Instructions (Signed)

## 2021-06-11 ENCOUNTER — Other Ambulatory Visit: Payer: Medicare HMO

## 2021-06-14 ENCOUNTER — Ambulatory Visit
Admission: RE | Admit: 2021-06-14 | Discharge: 2021-06-14 | Disposition: A | Discharge status: Home / Self Care | Payer: Medicare HMO | Source: Ambulatory Visit | Attending: Family Medicine | Admitting: Family Medicine

## 2021-06-14 DIAGNOSIS — Z1231 Encounter for screening mammogram for malignant neoplasm of breast: Secondary | ICD-10-CM | POA: Diagnosis not present

## 2021-07-02 ENCOUNTER — Ambulatory Visit (INDEPENDENT_AMBULATORY_CARE_PROVIDER_SITE_OTHER): Payer: Self-pay

## 2021-07-02 DIAGNOSIS — L603 Nail dystrophy: Secondary | ICD-10-CM

## 2021-07-02 DIAGNOSIS — B351 Tinea unguium: Secondary | ICD-10-CM

## 2021-07-02 NOTE — Progress Notes (Signed)
Patient presents today for the 2nd laser treatment. Diagnosed with mycotic nail infection by Dr. Milinda Pointer.   Toenail most affected right 3rd.  All other systems are negative.  Nails were filed thin. Laser therapy was administered to right 1-5 toenails  and patient tolerated the treatment well. All safety precautions were in place.   Pt stated that she would like to try formula 7.  Follow up in 4 weeks for laser # 3.

## 2021-07-19 ENCOUNTER — Ambulatory Visit: Payer: Medicare HMO

## 2021-07-19 ENCOUNTER — Encounter: Payer: Medicare HMO | Admitting: Podiatry

## 2021-07-19 DIAGNOSIS — M2041 Other hammer toe(s) (acquired), right foot: Secondary | ICD-10-CM

## 2021-07-21 ENCOUNTER — Ambulatory Visit: Payer: Medicare HMO | Admitting: Podiatry

## 2021-07-21 ENCOUNTER — Ambulatory Visit (INDEPENDENT_AMBULATORY_CARE_PROVIDER_SITE_OTHER): Payer: Medicare HMO

## 2021-07-21 ENCOUNTER — Other Ambulatory Visit: Payer: Self-pay | Admitting: Podiatry

## 2021-07-21 ENCOUNTER — Encounter: Payer: Self-pay | Admitting: Podiatry

## 2021-07-21 DIAGNOSIS — S90121A Contusion of right lesser toe(s) without damage to nail, initial encounter: Secondary | ICD-10-CM | POA: Diagnosis not present

## 2021-07-21 DIAGNOSIS — M2041 Other hammer toe(s) (acquired), right foot: Secondary | ICD-10-CM

## 2021-07-21 DIAGNOSIS — S92534A Nondisplaced fracture of distal phalanx of right lesser toe(s), initial encounter for closed fracture: Secondary | ICD-10-CM

## 2021-07-21 NOTE — Progress Notes (Signed)
She presents today states that she stubbed her third toe right foot.  She states has been over about a week ago but is still tender especially with shoes.  Objective: Pulses are palpable.  There is some mild erythema along the middle phalanx of the third toe on the right foot.  There is no ecchymosis.  Minimal edema.  Radiographs do demonstrate what appears to be a slightly oblique fracture line along the aspect of the middle phalanx though there is no dislocation and no comminution.  Assessment: Fracture third toe.  Plan: Demonstrated to her how to wrap the toe on a daily basis with Coban.  I will follow-up with her on an as-needed basis for this.

## 2021-07-26 ENCOUNTER — Other Ambulatory Visit: Payer: Self-pay

## 2021-07-29 ENCOUNTER — Ambulatory Visit (INDEPENDENT_AMBULATORY_CARE_PROVIDER_SITE_OTHER): Payer: Self-pay

## 2021-07-29 DIAGNOSIS — B351 Tinea unguium: Secondary | ICD-10-CM

## 2021-07-29 DIAGNOSIS — L603 Nail dystrophy: Secondary | ICD-10-CM

## 2021-07-29 NOTE — Progress Notes (Signed)
Patient presents today for the 3rd laser treatment. Diagnosed with mycotic nail infection by Dr. Milinda Pointer.   Toenail most affected right 3rd.  All other systems are negative.  Nails were filed thin. Laser therapy was administered to right 1-5 toenails  and patient tolerated the treatment well. All safety precautions were in place.   Pt stated that she would like to try formula 7.  Follow up in 6 weeks for laser # 4.

## 2021-08-02 NOTE — Progress Notes (Signed)
This encounter was created in error - please disregard.

## 2021-09-06 ENCOUNTER — Other Ambulatory Visit: Payer: Self-pay | Admitting: Internal Medicine

## 2021-09-06 DIAGNOSIS — R109 Unspecified abdominal pain: Secondary | ICD-10-CM

## 2021-09-06 DIAGNOSIS — R634 Abnormal weight loss: Secondary | ICD-10-CM

## 2021-09-10 ENCOUNTER — Encounter: Payer: Self-pay | Admitting: Podiatry

## 2021-09-10 ENCOUNTER — Ambulatory Visit (INDEPENDENT_AMBULATORY_CARE_PROVIDER_SITE_OTHER): Payer: Medicare HMO | Admitting: Podiatry

## 2021-09-10 DIAGNOSIS — L603 Nail dystrophy: Secondary | ICD-10-CM

## 2021-09-10 DIAGNOSIS — B351 Tinea unguium: Secondary | ICD-10-CM

## 2021-09-10 NOTE — Progress Notes (Signed)
Patient presents today for the 4th laser treatment. Diagnosed with mycotic nail infection by Dr. Milinda Pointer.    Toenail most affected right 3rd.   All other systems are negative.   Nails were filed thin. Laser therapy was administered to right 1-5 toenails  and patient tolerated the treatment well. All safety precautions were in place.    Pt stated she is no longer using Formula 7 due to it causing irritation and burning of the skin. Pt stated she does not believe the laser treatment is working. I advised patient to come back in 6 weeks and we will determine if she needs another round of laser or if she needs to see the provider.    Follow up in 6 weeks for laser # 5.

## 2021-09-13 MED ORDER — KETOCONAZOLE 2 % EX CREA: 1.0000 | TOPICAL_CREAM | Freq: Two times a day (BID) | CUTANEOUS | 2 refills | Status: AC

## 2021-09-16 ENCOUNTER — Ambulatory Visit
Admission: RE | Admit: 2021-09-16 | Discharge: 2021-09-16 | Disposition: A | Discharge status: Home / Self Care | Payer: Medicare HMO | Source: Ambulatory Visit | Attending: Internal Medicine | Admitting: Internal Medicine

## 2021-09-16 DIAGNOSIS — R109 Unspecified abdominal pain: Secondary | ICD-10-CM

## 2021-09-16 DIAGNOSIS — R634 Abnormal weight loss: Secondary | ICD-10-CM

## 2021-09-20 ENCOUNTER — Other Ambulatory Visit: Payer: Self-pay | Admitting: Internal Medicine

## 2021-09-20 DIAGNOSIS — R634 Abnormal weight loss: Secondary | ICD-10-CM

## 2021-09-20 DIAGNOSIS — R1084 Generalized abdominal pain: Secondary | ICD-10-CM

## 2021-09-20 DIAGNOSIS — K8689 Other specified diseases of pancreas: Secondary | ICD-10-CM

## 2021-09-21 ENCOUNTER — Ambulatory Visit
Admission: RE | Admit: 2021-09-21 | Discharge: 2021-09-21 | Disposition: A | Discharge status: Home / Self Care | Payer: Medicare HMO | Source: Ambulatory Visit | Attending: Internal Medicine | Admitting: Internal Medicine

## 2021-09-21 DIAGNOSIS — R634 Abnormal weight loss: Secondary | ICD-10-CM

## 2021-09-22 ENCOUNTER — Ambulatory Visit
Admission: RE | Admit: 2021-09-22 | Discharge: 2021-09-22 | Disposition: A | Discharge status: Home / Self Care | Payer: Medicare HMO | Source: Ambulatory Visit | Attending: Internal Medicine | Admitting: Internal Medicine

## 2021-09-22 DIAGNOSIS — R634 Abnormal weight loss: Secondary | ICD-10-CM

## 2021-09-22 MED ORDER — GADOBENATE DIMEGLUMINE 529 MG/ML IV SOLN
11.0000 mL | Freq: Once | INTRAVENOUS | Status: AC | PRN
  Administered 2021-09-22: 11 mL via INTRAVENOUS

## 2021-11-05 ENCOUNTER — Other Ambulatory Visit: Payer: Medicare HMO

## 2021-12-29 ENCOUNTER — Telehealth: Payer: Self-pay | Admitting: *Deleted

## 2021-12-29 ENCOUNTER — Encounter: Payer: Self-pay | Admitting: Podiatry

## 2021-12-29 ENCOUNTER — Ambulatory Visit: Payer: Medicare HMO | Admitting: Podiatry

## 2021-12-29 DIAGNOSIS — D2371 Other benign neoplasm of skin of right lower limb, including hip: Secondary | ICD-10-CM

## 2021-12-29 DIAGNOSIS — L600 Ingrowing nail: Secondary | ICD-10-CM | POA: Diagnosis not present

## 2021-12-29 MED ORDER — NEOMYCIN-POLYMYXIN-HC 1 % OT SOLN: OTIC | 1 refills | Status: AC

## 2021-12-29 NOTE — Patient Instructions (Signed)
Betadine Soak Instructions  Purchase an 8 oz. bottle of BETADINE solution (Povidone)  THE DAY AFTER THE PROCEDURE  Place 1 tablespoon of betadine solution in a quart of warm tap water.  Submerge your foot or feet with outer bandage intact for the initial soak; this will allow the bandage to become moist and wet for easy lift off.  Once you remove your bandage, continue to soak in the solution for 20 minutes.  This soak should be done twice a day.  Next, remove your foot or feet from solution, blot dry the affected area and cover.  You may use a band aid large enough to cover the area or use gauze and tape.  Apply other medications to the area as directed by the doctor such as cortisporin otic solution (ear drops) or neosporin.  IF YOUR SKIN BECOMES IRRITATED WHILE USING THESE INSTRUCTIONS, IT IS OKAY TO SWITCH TO EPSOM SALTS AND WATER OR WHITE VINEGAR AND WATER.   [facility] Instructions-Post Nail Surgery  You have had your ingrown toenail and root treated with a chemical.  This chemical causes a burn that will drain and ooze like a blister.  This can drain for 6-8 weeks or longer.  It is important to keep this area clean, covered, and follow the soaking instructions dispensed at the time of your surgery.  This area will eventually dry and form a scab.  Once the scab forms you no longer need to soak or apply a dressing.  If at any time you experience an increase in pain, redness, swelling, or drainage, you should contact the office as soon as possible.  

## 2021-12-29 NOTE — Telephone Encounter (Signed)
Patient is calling to clarify soaking instructions,read over the betadine soaks , verbalized understanding.

## 2021-12-29 NOTE — Progress Notes (Signed)
She presents today chief complaint of a painful callus plantar aspect of her foot she is also complaining of a painful hallux right.  Objective: Vital signs are stable she is alert and oriented x3.  Pulses are palpable.  Hallux valgus is resulting in compression against the lateral aspect of the hallux nail plate which is painful.  She is also complaining of a painful callus.  Assessment ingrown toenail fibular border hallux right hallux valgus right benign skin lesion.  Plan: Debridement of benign skin lesion chemical matricectomy of the hallux right.  Tolerated procedure well was given both oral and written home-going instruction for care and soaking of the foot follow-up with her in 2 to 3 weeks.

## 2022-01-17 ENCOUNTER — Encounter: Payer: Self-pay | Admitting: Podiatry

## 2022-01-17 ENCOUNTER — Ambulatory Visit: Payer: Medicare HMO | Admitting: Podiatry

## 2022-01-17 DIAGNOSIS — Z9889 Other specified postprocedural states: Secondary | ICD-10-CM | POA: Diagnosis not present

## 2022-01-17 DIAGNOSIS — L600 Ingrowing nail: Secondary | ICD-10-CM | POA: Diagnosis not present

## 2022-01-17 DIAGNOSIS — M2011 Hallux valgus (acquired), right foot: Secondary | ICD-10-CM | POA: Diagnosis not present

## 2022-01-17 MED ORDER — JUBLIA 10 % EX SOLN: 1.0000 [drp] | Freq: Every day | CUTANEOUS | 11 refills | Status: AC

## 2022-01-17 NOTE — Progress Notes (Signed)
She presents today for a nail check.  States that the matrixectomy is doing just fine no problems whatsoever continues to soak.  She is questioning laser therapy treatment made her second toe and her hallux nail thick when those toes did not even have any fungus.  She is also concerned about her hallux valgus.  Objective: Vital signs are stable alert and oriented x 3.  Pulses are palpable.  Her toe is gone on to heal up almost completely there is just mild erythema no cellulitis drainage or odor scab is present.  She does have hallux valgus that is not easily reducible most likely this is bone have to be effusion versus typical bunion repair.  Assessment: Hallux valgus deformity right hammertoe deformities right onychomycosis right.  Well-healing surgical toe hallux right.  Plan: Asked Dr. Sherryle Lis to take a look at her for possible fusion of that joint.  She will continue to soak every other day just until some of the mild erythema subsided.  We are going to start her on a topical Jublia

## 2022-01-24 ENCOUNTER — Ambulatory Visit: Payer: Medicare HMO | Admitting: Podiatry

## 2022-01-24 DIAGNOSIS — M21961 Unspecified acquired deformity of right lower leg: Secondary | ICD-10-CM | POA: Diagnosis not present

## 2022-01-24 DIAGNOSIS — M21611 Bunion of right foot: Secondary | ICD-10-CM

## 2022-01-24 DIAGNOSIS — E559 Vitamin D deficiency, unspecified: Secondary | ICD-10-CM | POA: Diagnosis not present

## 2022-01-24 DIAGNOSIS — M2011 Hallux valgus (acquired), right foot: Secondary | ICD-10-CM

## 2022-01-24 NOTE — Progress Notes (Signed)
  Subjective:  Patient ID: Shannon Weber, female    DOB: 1950-07-29,  MRN: [id number]  Chief Complaint  Patient presents with   Bunions    right foot bunion -Dr. Milinda Pointer referral    71 y.o. female presents with the above complaint. History confirmed with patient.  She is a painful right foot bunion it is difficult to wear certain shoes and activity she also is painful callus under the third toe  Objective:  Physical Exam: warm, good capillary refill, no trophic changes or ulcerative lesions, normal DP and PT pulses, and normal sensory exam.  Right Foot:  Painful hallux valgus deformity, crepitance on range of motion that is painful, painful hyperkeratotic lesion submetatarsal 3  No images are attached to the encounter.  Radiographs: Multiple views x-ray of the right foot:  Hallux valgus deformity with arthritic changes of the first MTPJ from plane rotation increased IM angle, third metatarsal is nearly as Kinderman as the second Assessment:   1. Hallux valgus with bunions, right   2. Metatarsal deformity, right   3. Vitamin D deficiency      Plan:  Patient was evaluated and treated and all questions answered.  Discussed the etiology and treatment including surgical and non surgical treatment for painful bunions and the painful callus under the third toe.  She has exhausted all non surgical treatment prior to this visit including shoe gear changes and padding.  She desires surgical intervention. We discussed all risks including but not limited to: pain, swelling, infection, scar, numbness which may be temporary or permanent, chronic pain, stiffness, nerve pain or damage, wound healing problems, bone healing problems including delayed or non-union and recurrence. Specifically we discussed the following procedures: First MPJ fusion with correction of hallux valgus, bone graft in the heel and shortening osteotomy of the third metatarsal. Informed consent was signed today. Surgery will be  scheduled at a mutually agreeable date. Information regarding this will be forwarded to our surgery scheduler.  Vitamin D calcium level was ordered.  She is to be nonweightbearing with a knee scooter   Surgical plan:  Procedure: -Right foot First MPJ fusion with correction of hallux valgus, bone graft in the heel and shortening osteotomy of the third metatarsal  Location: -[facility]  Anesthesia plan: -IV sedation with regional block  Postoperative pain plan: - Tylenol 1000 mg every 6 hours, ibuprofen 600 mg every 6 hours, gabapentin 300 mg every 8 hours x5 days, oxycodone 5 mg 1-2 tabs every 6 hours only as needed  DVT prophylaxis: -None required  WB Restrictions / DME needs: -NWB in splint postop with knee scooter  No follow-ups on file.

## 2022-01-25 ENCOUNTER — Telehealth: Payer: Self-pay | Admitting: Podiatry

## 2022-01-25 LAB — CALCIUM: Calcium: 9.2 mg/dL (ref 8.7–10.3)

## 2022-01-25 LAB — VITAMIN D 25 HYDROXY (VIT D DEFICIENCY, FRACTURES): Vit D, 25-Hydroxy: 28.2 ng/mL — ABNORMAL LOW (ref 30.0–100.0)

## 2022-01-25 MED ORDER — VITAMIN D (ERGOCALCIFEROL) 1.25 MG (50000 UNIT) PO CAPS: 50000.0000 [IU] | ORAL_CAPSULE | ORAL | 0 refills | Status: DC

## 2022-01-25 NOTE — Telephone Encounter (Signed)
Pt called and was seen yesterday in [place] office and you discussed surgery and drew a picture for her but when she got home it was not in the paperwork she got. She is asking if you could send her the picture thru email or my chart.

## 2022-01-25 NOTE — Addendum Note (Signed)
Addended bySherryle Lis, Abdirizak Richison R on: 01/25/2022 07:55 AM   Modules accepted: Orders

## 2022-05-11 ENCOUNTER — Ambulatory Visit: Payer: Medicare HMO | Admitting: Podiatry

## 2022-05-11 DIAGNOSIS — M2011 Hallux valgus (acquired), right foot: Secondary | ICD-10-CM | POA: Diagnosis not present

## 2022-05-11 DIAGNOSIS — L84 Corns and callosities: Secondary | ICD-10-CM | POA: Diagnosis not present

## 2022-05-11 DIAGNOSIS — M216X1 Other acquired deformities of right foot: Secondary | ICD-10-CM

## 2022-05-11 NOTE — Patient Instructions (Signed)
Look for urea 40% cream or ointment and apply to the thickened dry skin / calluses. This can be bought over the counter, at a pharmacy or online such as [employer].   Foam or Felt callus or aperture pads can be bought on [employer], put these around the callus

## 2022-05-11 NOTE — Progress Notes (Signed)
  Subjective:  Patient ID: Shannon Weber, female    DOB: May 26, 1950,  MRN: [id number]  Chief Complaint  Patient presents with   Callouses    -right foot callus causing discomfort    72 y.o. female presents with the above complaint. History confirmed with patient.  He returns for follow-up, she was not able to undergo surgery at this time she is taking care of her mother who is in her 5s.  Her callus on the right foot has returned and is painful.  Objective:  Physical Exam: warm, good capillary refill, no trophic changes or ulcerative lesions, normal DP and PT pulses, normal sensory exam, and hallux valgus forming on the right foot with hammertoe contractures, plantarflexed metatarsals with hyperkeratotic lesion submetatarsal 3 and 4  Assessment:   1. Callus of foot   2. 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 Hav (hallux abducto valgus), right      Plan:  Patient was evaluated and treated and all questions answered.  Callus was debrided as a courtesy today.  We again discussed nonoperative treatment options including use of urea cream and a picture offloading pads.  She will work on using these.  She will let me know if this continues to progress and if she is interested in surgical correction.  Return if symptoms worsen or fail to improve.

## 2022-06-15 ENCOUNTER — Other Ambulatory Visit: Payer: Self-pay | Admitting: Internal Medicine

## 2022-06-15 DIAGNOSIS — Z1231 Encounter for screening mammogram for malignant neoplasm of breast: Secondary | ICD-10-CM

## 2022-07-13 ENCOUNTER — Ambulatory Visit
Admission: RE | Admit: 2022-07-13 | Discharge: 2022-07-13 | Disposition: A | Discharge status: Home / Self Care | Payer: Medicare HMO | Source: Ambulatory Visit | Attending: Internal Medicine | Admitting: Internal Medicine

## 2022-07-13 DIAGNOSIS — Z1231 Encounter for screening mammogram for malignant neoplasm of breast: Secondary | ICD-10-CM | POA: Insufficient documentation

## 2023-01-10 ENCOUNTER — Other Ambulatory Visit: Payer: Self-pay | Admitting: Nurse Practitioner

## 2023-01-10 DIAGNOSIS — R1084 Generalized abdominal pain: Secondary | ICD-10-CM

## 2023-01-24 ENCOUNTER — Ambulatory Visit: Payer: Medicare HMO

## 2023-01-25 ENCOUNTER — Ambulatory Visit
Admission: RE | Admit: 2023-01-25 | Discharge: 2023-01-25 | Disposition: A | Discharge status: Home / Self Care | Payer: Medicare HMO | Source: Ambulatory Visit | Attending: Nurse Practitioner | Admitting: Nurse Practitioner

## 2023-01-25 DIAGNOSIS — R1084 Generalized abdominal pain: Secondary | ICD-10-CM | POA: Diagnosis present

## 2023-02-20 DIAGNOSIS — K080 Exfoliation of teeth due to systemic causes: Secondary | ICD-10-CM | POA: Diagnosis not present

## 2023-03-22 DIAGNOSIS — D225 Melanocytic nevi of trunk: Secondary | ICD-10-CM | POA: Diagnosis not present

## 2023-03-22 DIAGNOSIS — D2262 Melanocytic nevi of left upper limb, including shoulder: Secondary | ICD-10-CM | POA: Diagnosis not present

## 2023-03-22 DIAGNOSIS — D2261 Melanocytic nevi of right upper limb, including shoulder: Secondary | ICD-10-CM | POA: Diagnosis not present

## 2023-03-22 DIAGNOSIS — D2271 Melanocytic nevi of right lower limb, including hip: Secondary | ICD-10-CM | POA: Diagnosis not present

## 2023-03-23 ENCOUNTER — Encounter: Payer: Self-pay | Admitting: Gastroenterology

## 2023-03-23 NOTE — Progress Notes (Signed)
 Pt called Endoscopy stating that she did not receive instructions for Colonoscopy scheduled for next week.  This nurse called and spoke with receptionist who stated that pt was seen in office 12/2622 and she was given her instructions at appointment. Pt requested instructions to be sent to mychart. Pt notified that instructions will be sent to mychart. Pre call was completed.

## 2023-03-29 NOTE — H&P (Signed)
Pre-Procedure H&P   Patient ID: Shannon Weber is a 73 y.o. female.  Gastroenterology Provider: Jaynie Collins, DO  Referring Provider: Fransico Setters, NP PCP: Danella Penton, MD  Date: 03/30/2023  HPI Ms. Shannon Weber is a 73 y.o. female who presents today for Colonoscopy for Personal history of colon polyps; family history colon cancer .  Father with history of colon cancer  Last colonoscopy in October 2019 and 1 adenomatous polyp internal hemorrhoids.  Notably restricted movement in the colon as well as tortuosity. Similar findings in colonoscopy in 2014  More recently has had some lower abdominal discomfort.  Reports 2 bowel moods per day without melena or hematochezia Status post right hip replacement Creatinine 0.7 hemoglobin 13.1 MCV 94 platelets 233,000 iron saturation 25% ferritin 99 celiac negative H. pylori negative   Past Medical History:  Diagnosis Date   Abnormal serum lipase level    Atrophic vaginitis 02/21/2016   Chocolate cyst of ovary    COVID-19 07/25/2020   "mild" -cough, sneeze, congestion, sore throat, headache.  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;  Service: Ophthalmology;  Laterality: Left;   CATARACT EXTRACTION W/PHACO Right 08/10/2020   Procedure: CATARACT EXTRACTION PHACO AND INTRAOCULAR LENS PLACEMENT (IOC) RIGHT TECNIS LOW ADD 10.77 01:31.1;   Surgeon: Lockie Mola, MD;  Location: [facility];  Service: Ophthalmology;  Laterality: Right;   COLONOSCOPY  2014   COLONOSCOPY W/ POLYPECTOMY     EXTRACORPOREAL SHOCK WAVE LITHOTRIPSY Left 12/25/2014   Procedure: EXTRACORPOREAL SHOCK WAVE LITHOTRIPSY (ESWL);  Surgeon: Orson Ape, MD;  Location: [facility];  Service: Urology;  Laterality: Left;   mole removed     REPLACEMENT TOTAL HIP W/  RESURFACING IMPLANTS Right 04/13/2015   TOTAL HIP ARTHROPLASTY Right [id number]    Family History Father-colon cancer No other h/o GI disease or malignancy  Review of Systems  Constitutional:  Negative for activity change, appetite change, chills, diaphoresis, fatigue, fever and unexpected weight change.  HENT:  Negative for trouble swallowing and voice change.   Respiratory:  Negative for shortness of breath and wheezing.   Cardiovascular:  Negative for chest pain, palpitations and leg swelling.  Gastrointestinal:  Positive for abdominal pain. Negative for abdominal distention, anal bleeding, blood in stool, constipation, diarrhea, nausea, rectal pain and vomiting.  Musculoskeletal:  Negative for arthralgias and myalgias.  Skin:  Negative for color change and pallor.  Neurological:  Negative for dizziness, syncope and weakness.  Psychiatric/Behavioral:  Negative for confusion.   All other systems reviewed and are negative.    Medications No current facility-administered medications on file prior to encounter.   Current Outpatient Medications on File Prior to Encounter  Medication Sig Dispense Refill   Cholecalciferol (VITAMIN D PO) Take by mouth daily. Reported on 04/10/2015     busPIRone (BUSPAR) 5  MG tablet Take 5 mg by mouth 2 (two) times daily. (Patient not taking: Reported on 03/30/2023)     citalopram (CELEXA) 20 MG tablet TAKE 1 TABLET DAILY (Patient not taking: Reported on 03/23/2023) 90 tablet 1   estradiol (ESTRACE) 0.1 MG/GM vaginal cream PLACE 1 APPLICATORFULL VAGINALLY  ONCE A WEEK (REDUCE FREQUENCY TO JUST ONCE A WEEK) 42.5 g 0   JUBLIA 10 % SOLN Apply 1 drop topically daily. 8 mL 11   ketoconazole (NIZORAL) 2 % cream Apply 1 Application topically 2 (two) times daily. 15 g 2   Misc Natural Products (GLUCOSAMINE CHONDROITIN ADV PO) Take by mouth 2 (two) times daily. Reported on 04/10/2015     NEOMYCIN-POLYMYXIN-HYDROCORTISONE (CORTISPORIN) 1 % SOLN OTIC solution Apply 1-2 drops to toe BID after soaking 10 mL 1   TURMERIC PO Take by mouth.     venlafaxine XR (EFFEXOR-XR) 37.5 MG 24 hr capsule Take 37.5 mg by mouth daily. (Patient not taking: Reported on 03/23/2023)     Vitamin D, Ergocalciferol, (DRISDOL) 1.25 MG (50000 UNIT) CAPS capsule Take 1 capsule (50,000 Units total) by mouth every 7 (seven) days. (Patient not taking: Reported on 03/30/2023) 8 capsule 0    Pertinent medications related to GI and procedure were reviewed by me with the patient prior to the procedure   Current Facility-Administered Medications:    0.9 %  sodium chloride infusion, , Intravenous, Continuous, Jaynie Collins, DO, Last Rate: 20 mL/hr at 03/30/23 1610, Continued from Pre-op at 03/30/23 0943  sodium chloride 20 mL/hr at 03/30/23 9604       Allergies  Allergen Reactions   Lamisil [Terbinafine] Itching   Levaquin [Levofloxacin In D5w] Anxiety and Other (See Comments)    Had anxiety, shakey, "not herself", hallucinations   Allergies were reviewed by me prior to the procedure  Objective   Body mass index is 19.14 kg/m. Vitals:   03/30/23 0919  BP: 110/72  Pulse: 64  Resp: 18  Temp: (!) 96.4 F (35.8 C)  TempSrc: Temporal  SpO2: 100%  Weight: 53.8 kg  Height: 5\' 6"  (1.676 m)     Physical Exam Vitals and nursing note reviewed.  Constitutional:      General: She is not in acute distress.    Appearance: Normal appearance. She is not ill-appearing, toxic-appearing or diaphoretic.  HENT:     Head: Normocephalic and atraumatic.     Nose: Nose normal.      Mouth/Throat:     Mouth: Mucous membranes are moist.     Pharynx: Oropharynx is clear.  Eyes:     General: No scleral icterus.    Extraocular Movements: Extraocular movements intact.  Cardiovascular:     Rate and Rhythm: Normal rate and regular rhythm.     Heart sounds: Normal heart sounds. No murmur heard.    No friction rub. No gallop.  Pulmonary:     Effort: Pulmonary effort is normal. No respiratory distress.     Breath sounds: Normal breath sounds. No wheezing, rhonchi or rales.  Abdominal:     General: Bowel sounds are normal. There is no distension.     Palpations: Abdomen is soft.     Tenderness: There is no abdominal tenderness. There is no guarding or rebound.  Musculoskeletal:     Cervical back: Neck supple.     Right lower leg: No edema.     Left lower leg: No edema.  Skin:    General: Skin is warm and dry.  Coloration: Skin is not jaundiced or pale.  Neurological:     General: No focal deficit present.     Mental Status: She is alert and oriented to person, place, and time. Mental status is at baseline.  Psychiatric:        Mood and Affect: Mood normal.        Behavior: Behavior normal.        Thought Content: Thought content normal.        Judgment: Judgment normal.      Assessment:  Ms. Shannon Weber is a 73 y.o. female  who presents today for Colonoscopy for Personal history of colon polyps; family history colon cancer .  Plan:  Colonoscopy with possible intervention today  Colonoscopy with possible biopsy, control of bleeding, polypectomy, and interventions as necessary has been discussed with the patient/patient representative. Informed consent was obtained from the patient/patient representative after explaining the indication, nature, and risks of the procedure including but not limited to death, bleeding, perforation, missed neoplasm/lesions, cardiorespiratory compromise, and reaction to medications. Opportunity for questions was given and  appropriate answers were provided. Patient/patient representative has verbalized understanding is amenable to undergoing the procedure.   Jaynie Collins, DO  [facility]  Portions of the record may have been created with voice recognition software. Occasional wrong-word or 'sound-a-like' substitutions may have occurred due to the inherent limitations of voice recognition software.  Read the chart carefully and recognize, using context, where substitutions may have occurred.

## 2023-03-30 ENCOUNTER — Ambulatory Visit
Admission: RE | Admit: 2023-03-30 | Discharge: 2023-03-30 | Disposition: A | Discharge status: Home / Self Care | Payer: Medicare Other | Attending: Gastroenterology | Admitting: Gastroenterology

## 2023-03-30 ENCOUNTER — Other Ambulatory Visit: Payer: Self-pay

## 2023-03-30 ENCOUNTER — Ambulatory Visit: Payer: Medicare Other | Admitting: Anesthesiology

## 2023-03-30 ENCOUNTER — Encounter
Admission: RE | Disposition: A | Discharge status: Home / Self Care | Payer: Self-pay | Source: Home / Self Care | Attending: Gastroenterology

## 2023-03-30 ENCOUNTER — Encounter: Payer: Self-pay | Admitting: Gastroenterology

## 2023-03-30 DIAGNOSIS — Z860101 Personal history of adenomatous and serrated colon polyps: Secondary | ICD-10-CM | POA: Diagnosis not present

## 2023-03-30 DIAGNOSIS — D128 Benign neoplasm of rectum: Secondary | ICD-10-CM | POA: Insufficient documentation

## 2023-03-30 DIAGNOSIS — K5730 Diverticulosis of large intestine without perforation or abscess without bleeding: Secondary | ICD-10-CM | POA: Insufficient documentation

## 2023-03-30 DIAGNOSIS — Z1211 Encounter for screening for malignant neoplasm of colon: Secondary | ICD-10-CM | POA: Diagnosis not present

## 2023-03-30 DIAGNOSIS — Z96641 Presence of right artificial hip joint: Secondary | ICD-10-CM | POA: Diagnosis not present

## 2023-03-30 DIAGNOSIS — D125 Benign neoplasm of sigmoid colon: Secondary | ICD-10-CM | POA: Insufficient documentation

## 2023-03-30 DIAGNOSIS — D124 Benign neoplasm of descending colon: Secondary | ICD-10-CM | POA: Diagnosis not present

## 2023-03-30 DIAGNOSIS — K635 Polyp of colon: Secondary | ICD-10-CM | POA: Diagnosis not present

## 2023-03-30 DIAGNOSIS — Z800 Family history of malignant neoplasm of digestive organs: Secondary | ICD-10-CM | POA: Insufficient documentation

## 2023-03-30 HISTORY — PX: COLONOSCOPY WITH PROPOFOL: SHX5780

## 2023-03-30 HISTORY — PX: POLYPECTOMY: SHX5525

## 2023-03-30 SURGERY — COLONOSCOPY WITH PROPOFOL
Anesthesia: General

## 2023-03-30 MED ORDER — LIDOCAINE HCL (CARDIAC) PF 100 MG/5ML IV SOSY
PREFILLED_SYRINGE | INTRAVENOUS | Status: DC | PRN
  Administered 2023-03-30: 60 mg via INTRAVENOUS

## 2023-03-30 MED ORDER — SODIUM CHLORIDE 0.9 % IV SOLN: INTRAVENOUS | Status: DC

## 2023-03-30 MED ORDER — LIDOCAINE HCL (PF) 2 % IJ SOLN
INTRAMUSCULAR | Status: AC
  Filled 2023-03-30: qty 5

## 2023-03-30 MED ORDER — PROPOFOL 500 MG/50ML IV EMUL
INTRAVENOUS | Status: DC | PRN
  Administered 2023-03-30: 25 mg via INTRAVENOUS
  Administered 2023-03-30: 80 ug/kg/min via INTRAVENOUS
  Administered 2023-03-30: 75 mg via INTRAVENOUS

## 2023-03-30 MED ORDER — PROPOFOL 10 MG/ML IV BOLUS
INTRAVENOUS | Status: AC
  Filled 2023-03-30: qty 20

## 2023-03-30 NOTE — Anesthesia Preprocedure Evaluation (Addendum)
Anesthesia Evaluation  Patient identified by MRN, date of birth, ID band Patient awake    Reviewed: Allergy & Precautions, H&P , NPO status , Patient's Chart, lab work & pertinent test results  Airway Mallampati: II  TM Distance: >3 FB Neck ROM: full    Dental no notable dental hx.    Pulmonary neg pulmonary ROS   Pulmonary exam normal        Cardiovascular  Rhythm:Irregular Rate:Normal - Systolic murmurs PVC's (premature ventricular contractions)   Neuro/Psych  PSYCHIATRIC DISORDERS  Depression    negative neurological ROS     GI/Hepatic Neg liver ROS,GERD  ,,  Endo/Other  negative endocrine ROS    Renal/GU negative Renal ROS  negative genitourinary   Musculoskeletal   Abdominal Normal abdominal exam  (+)   Peds  Hematology negative hematology ROS (+)   Anesthesia Other Findings Past Medical History: No date: Abnormal serum lipase level 02/21/2016: Atrophic vaginitis No date: Chocolate cyst of ovary 07/25/2020: COVID-19     Comment:  "mild" -cough, sneeze, congestion, sore throat,               headache.  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) No date: Onychomycosis No date: Varicose vein of leg  Past Surgical History: No date: ABDOMINAL HYSTERECTOMY 1980's: BREAST BIOPSY; Left     Comment:  negative 2008: BREAST CYST ASPIRATION; Right     Comment:  negative 1989: BREAST EXCISIONAL BIOPSY; Left     Comment:  benign per pt 08/26/2020: CATARACT EXTRACTION W/PHACO; Left     Comment:  Procedure: CATARACT EXTRACTION PHACO AND INTRAOCULAR               LENS PLACEMENT (IOC) LEFT TECNIS TORIC LOW ADD 9.98               01:22.9;  Surgeon: Lockie Mola, MD;  Location:               [facility];  Service: Ophthalmology;                Laterality:  Left; 08/10/2020: CATARACT EXTRACTION W/PHACO; Right     Comment:  Procedure: CATARACT EXTRACTION PHACO AND INTRAOCULAR               LENS PLACEMENT (IOC) RIGHT TECNIS LOW ADD 10.77 01:31.1;               Surgeon: Lockie Mola, MD;  Location: [facility];  Service: Ophthalmology;  Laterality:               Right; 2014: COLONOSCOPY No date: COLONOSCOPY W/ POLYPECTOMY 12/25/2014: EXTRACORPOREAL SHOCK WAVE LITHOTRIPSY; Left     Comment:  Procedure: EXTRACORPOREAL SHOCK WAVE LITHOTRIPSY (ESWL);              Surgeon: Orson Ape, MD;  Location: [facility];                Service: Urology;  Laterality: Left; No date: mole removed 04/13/2015: REPLACEMENT TOTAL HIP W/  RESURFACING IMPLANTS; Right [id number]: TOTAL HIP ARTHROPLASTY; Right     Reproductive/Obstetrics negative OB ROS                             Anesthesia Physical  Anesthesia Plan  ASA: 2  Anesthesia Plan: General   Post-op Pain Management:    Induction:   PONV Risk Score and Plan: Propofol infusion and TIVA  Airway Management Planned:   Additional Equipment:   Intra-op Plan:   Post-operative Plan:   Informed Consent: I have reviewed the patients History and Physical, chart, labs and discussed the procedure including the risks, benefits and alternatives for the proposed anesthesia with the patient or authorized representative who has indicated his/her understanding and acceptance.     Dental Advisory Given  Plan Discussed with: CRNA and Surgeon  Anesthesia Plan Comments:         Anesthesia Quick Evaluation

## 2023-03-30 NOTE — Op Note (Signed)
[facility] Patient Name: Shannon Weber Procedure Date: 03/30/2023 9:39 AM MRN: [id number] Account #: [id number] Date of Birth: 1950-09-19 Admit Type: Outpatient Age: 73 Room: [facility] ENDO ROOM 1 Gender: Female Note Status: Finalized Instrument Name: Peds Colonoscope [id number] Procedure:             Colonoscopy Indications:           Screening in patient at increased risk: Family history                         of 1st-degree relative with colorectal cancer, High                         risk colon cancer surveillance: Personal history of                         colonic polyps Providers:             Jaynie Collins DO, DO Referring MD:          Danella Penton, MD (Referring MD) Medicines:             Monitored Anesthesia Care Complications:         No immediate complications. Estimated blood loss:                         Minimal. Procedure:             Pre-Anesthesia Assessment:                        - Prior to the procedure, a History and Physical was                         performed, and patient medications and allergies were                         reviewed. The patient is competent. The risks and                         benefits of the procedure and the sedation options and                         risks were discussed with the patient. All questions                         were answered and informed consent was obtained.                         Patient identification and proposed procedure were                         verified by the physician, the nurse, the anesthetist                         and the technician in the endoscopy suite. Mental                         Status Examination: alert and oriented. Airway  Examination: normal oropharyngeal airway and neck                         mobility. Respiratory Examination: clear to                         auscultation. CV Examination: RRR, no murmurs, no S3                          or S4. Prophylactic Antibiotics: The patient does not                         require prophylactic antibiotics. Prior                         Anticoagulants: The patient has taken no anticoagulant                         or antiplatelet agents. ASA Grade Assessment: II - A                         patient with mild systemic disease. After reviewing                         the risks and benefits, the patient was deemed in                         satisfactory condition to undergo the procedure. The                         anesthesia plan was to use monitored anesthesia care                         (MAC). Immediately prior to administration of                         medications, the patient was re-assessed for adequacy                         to receive sedatives. The heart rate, respiratory                         rate, oxygen saturations, blood pressure, adequacy of                         pulmonary ventilation, and response to care were                         monitored throughout the procedure. The physical                         status of the patient was re-assessed after the                         procedure.                        After obtaining informed consent, the colonoscope was  passed under direct vision. Throughout the procedure,                         the patient's blood pressure, pulse, and oxygen                         saturations were monitored continuously. The                         Colonoscope was introduced through the anus and                         advanced to the the cecum, identified by appendiceal                         orifice and ileocecal valve. The colonoscopy was                         performed without difficulty. The colonoscopy was                         somewhat difficult due to restricted mobility of the                         colon. Successful completion of the procedure was                         aided by applying  abdominal pressure and lavage. The                         patient tolerated the procedure well. The quality of                         the bowel preparation was evaluated using the BBPS                         [facility] Bowel Preparation Scale) with scores of: Right                         Colon = 3, Transverse Colon = 3 and Left Colon = 3                         (entire mucosa seen well with no residual staining,                         small fragments of stool or opaque liquid). The total                         BBPS score equals 9. The ileocecal valve, appendiceal                         orifice, and rectum were photographed. Findings:      The perianal and digital rectal examinations were normal. Pertinent       negatives include normal sphincter tone.      A few small-mouthed diverticula were found in the sigmoid colon.       Estimated blood loss: none.  Three sessile polyps were found in the rectum, sigmoid colon and       descending colon. The polyps were 1 to 2 mm in size. These polyps were       removed with a jumbo cold forceps. Resection and retrieval were       complete. Estimated blood loss was minimal.      A 4 to 5 mm polyp was found in the descending colon. The polyp was       sessile. The polyp was removed with a cold snare. Resection and       retrieval were complete. Estimated blood loss was minimal.      The exam was otherwise without abnormality on direct and retroflexion       views. Impression:            - Diverticulosis in the sigmoid colon.                        - Three 1 to 2 mm polyps in the rectum, in the sigmoid                         colon and in the descending colon, removed with a                         jumbo cold forceps. Resected and retrieved.                        - One 4 to 5 mm polyp in the descending colon, removed                         with a cold snare. Resected and retrieved.                        - The examination was otherwise normal  on direct and                         retroflexion views. Recommendation:        - Patient has a contact number available for                         emergencies. The signs and symptoms of potential                         delayed complications were discussed with the patient.                         Return to normal activities tomorrow. Written                         discharge instructions were provided to the patient.                        - Discharge patient to home.                        - Resume previous diet.                        - Continue present medications.                        -  Await pathology results.                        - No ibuprofen, naproxen, or other non-steroidal                         anti-inflammatory drugs for 5 days after polyp removal.                        - Repeat colonoscopy for surveillance based on                         pathology results.                        - Return to referring physician as previously                         scheduled.                        - The findings and recommendations were discussed with                         the patient. Procedure Code(s):     --- Professional ---                        [phone], Colonoscopy, flexible; with removal of                         tumor(s), polyp(s), or other lesion(s) by snare                         technique                        [id number], 59, Colonoscopy, flexible; with biopsy, single                         or multiple Diagnosis Code(s):     --- Professional ---                        Z80.0, Family history of malignant neoplasm of                         digestive organs                        Z86.010, Personal history of colonic polyps                        D12.8, Benign neoplasm of rectum                        D12.5, Benign neoplasm of sigmoid colon                        D12.4, Benign neoplasm of descending colon                        K57.30, Diverticulosis of large intestine  without  perforation or abscess without bleeding CPT copyright 2022 American Medical Association. All rights reserved. The codes documented in this report are preliminary and upon coder review may  be revised to meet current compliance requirements. Attending Participation:      I personally performed the entire procedure. Elfredia Nevins, DO Jaynie Collins DO, DO 03/30/2023 10:25:41 AM This report has been signed electronically. Number of Addenda: 0 Note Initiated On: 03/30/2023 9:39 AM Scope Withdrawal Time: 0 hours 16 minutes 4 seconds  Total Procedure Duration: 0 hours 22 minutes 35 seconds  Estimated Blood Loss:  Estimated blood loss was minimal.      [facility]

## 2023-03-30 NOTE — Anesthesia Postprocedure Evaluation (Signed)
Anesthesia Post Note  Patient: Shannon Weber  Procedure(s) Performed: COLONOSCOPY WITH PROPOFOL POLYPECTOMY  Patient location during evaluation: PACU Anesthesia Type: General Level of consciousness: awake and alert Pain management: pain level controlled Vital Signs Assessment: post-procedure vital signs reviewed and stable Respiratory status: spontaneous breathing, nonlabored ventilation and respiratory function stable Cardiovascular status: blood pressure returned to baseline and stable Postop Assessment: no apparent nausea or vomiting Anesthetic complications: no   No notable events documented.   Last Vitals:  Vitals:   03/30/23 1025 03/30/23 1033  BP: 134/72 123/67  Pulse: 75   Resp: 13   Temp:    SpO2: 100% 100%    Last Pain:  Vitals:   03/30/23 1023  TempSrc: Temporal  PainSc: 0-No pain                 Foye Deer

## 2023-03-30 NOTE — Interval H&P Note (Signed)
History and Physical Interval Note: Preprocedure H&P from 03/30/23  was reviewed and there was no interval change after seeing and examining the patient.  Written consent was obtained from the patient after discussion of risks, benefits, and alternatives. Patient has consented to proceed with Colonoscopy with possible intervention   03/30/2023 9:46 AM  Shannon Weber  has presented today for surgery, with the diagnosis of Z86.0101 (ICD-10-CM) - History of adenomatous polyp of colon Z80.0 (ICD-10-CM) - FH: colon cancer.  The various methods of treatment have been discussed with the patient and family. After consideration of risks, benefits and other options for treatment, the patient has consented to  Procedure(s): COLONOSCOPY WITH PROPOFOL (N/A) as a surgical intervention.  The patient's history has been reviewed, patient examined, no change in status, stable for surgery.  I have reviewed the patient's chart and labs.  Questions were answered to the patient's satisfaction.     Shannon Weber

## 2023-03-30 NOTE — Transfer of Care (Signed)
Immediate Anesthesia Transfer of Care Note  Patient: Shannon Weber  Procedure(s) Performed: COLONOSCOPY WITH PROPOFOL POLYPECTOMY  Patient Location: PACU and Endoscopy Unit  Anesthesia Type:General  Level of Consciousness: drowsy  Airway & Oxygen Therapy: Patient Spontanous Breathing  Post-op Assessment: Report given to RN and Post -op Vital signs reviewed and stable  Post vital signs: Reviewed and stable  Last Vitals:  Vitals Value Taken Time  BP 134/72 03/30/23 1023  Temp    Pulse 76 03/30/23 1024  Resp 17 03/30/23 1024  SpO2 100 % 03/30/23 1024  Vitals shown include unfiled device data.  Last Pain:  Vitals:   03/30/23 0919  TempSrc: Temporal  PainSc: 0-No pain         Complications: No notable events documented.

## 2023-03-31 ENCOUNTER — Encounter: Payer: Self-pay | Admitting: Gastroenterology

## 2023-03-31 LAB — SURGICAL PATHOLOGY

## 2023-04-20 DIAGNOSIS — M1712 Unilateral primary osteoarthritis, left knee: Secondary | ICD-10-CM | POA: Diagnosis not present

## 2023-05-30 DIAGNOSIS — K080 Exfoliation of teeth due to systemic causes: Secondary | ICD-10-CM | POA: Diagnosis not present

## 2023-06-25 DIAGNOSIS — J0100 Acute maxillary sinusitis, unspecified: Secondary | ICD-10-CM | POA: Diagnosis not present

## 2023-07-06 DIAGNOSIS — Z96641 Presence of right artificial hip joint: Secondary | ICD-10-CM | POA: Diagnosis not present

## 2023-07-06 DIAGNOSIS — M7061 Trochanteric bursitis, right hip: Secondary | ICD-10-CM | POA: Diagnosis not present

## 2023-07-06 DIAGNOSIS — Z09 Encounter for follow-up examination after completed treatment for conditions other than malignant neoplasm: Secondary | ICD-10-CM | POA: Diagnosis not present

## 2023-07-06 DIAGNOSIS — M76891 Other specified enthesopathies of right lower limb, excluding foot: Secondary | ICD-10-CM | POA: Diagnosis not present

## 2023-08-08 DIAGNOSIS — Z96641 Presence of right artificial hip joint: Secondary | ICD-10-CM | POA: Diagnosis not present

## 2023-08-09 DIAGNOSIS — F411 Generalized anxiety disorder: Secondary | ICD-10-CM | POA: Diagnosis not present

## 2023-08-16 DIAGNOSIS — Z96641 Presence of right artificial hip joint: Secondary | ICD-10-CM | POA: Diagnosis not present

## 2023-08-16 DIAGNOSIS — F411 Generalized anxiety disorder: Secondary | ICD-10-CM | POA: Diagnosis not present

## 2023-08-23 DIAGNOSIS — F411 Generalized anxiety disorder: Secondary | ICD-10-CM | POA: Diagnosis not present

## 2023-08-29 DIAGNOSIS — K080 Exfoliation of teeth due to systemic causes: Secondary | ICD-10-CM | POA: Diagnosis not present

## 2023-08-30 DIAGNOSIS — Z96641 Presence of right artificial hip joint: Secondary | ICD-10-CM | POA: Diagnosis not present

## 2023-08-31 DIAGNOSIS — H524 Presbyopia: Secondary | ICD-10-CM | POA: Diagnosis not present

## 2023-09-05 DIAGNOSIS — Z79899 Other long term (current) drug therapy: Secondary | ICD-10-CM | POA: Diagnosis not present

## 2023-09-05 DIAGNOSIS — R739 Hyperglycemia, unspecified: Secondary | ICD-10-CM | POA: Diagnosis not present

## 2023-09-05 DIAGNOSIS — E782 Mixed hyperlipidemia: Secondary | ICD-10-CM | POA: Diagnosis not present

## 2023-09-06 DIAGNOSIS — F411 Generalized anxiety disorder: Secondary | ICD-10-CM | POA: Diagnosis not present

## 2023-09-12 DIAGNOSIS — F32A Depression, unspecified: Secondary | ICD-10-CM | POA: Diagnosis not present

## 2023-09-12 DIAGNOSIS — Z0000 Encounter for general adult medical examination without abnormal findings: Secondary | ICD-10-CM | POA: Diagnosis not present

## 2023-09-12 DIAGNOSIS — M8588 Other specified disorders of bone density and structure, other site: Secondary | ICD-10-CM | POA: Diagnosis not present

## 2023-09-12 DIAGNOSIS — Z1231 Encounter for screening mammogram for malignant neoplasm of breast: Secondary | ICD-10-CM | POA: Diagnosis not present

## 2023-09-12 DIAGNOSIS — Z79899 Other long term (current) drug therapy: Secondary | ICD-10-CM | POA: Diagnosis not present

## 2023-09-12 DIAGNOSIS — Z1331 Encounter for screening for depression: Secondary | ICD-10-CM | POA: Diagnosis not present

## 2023-09-13 DIAGNOSIS — F411 Generalized anxiety disorder: Secondary | ICD-10-CM | POA: Diagnosis not present

## 2023-09-14 DIAGNOSIS — Z96641 Presence of right artificial hip joint: Secondary | ICD-10-CM | POA: Diagnosis not present

## 2023-09-19 ENCOUNTER — Other Ambulatory Visit: Payer: Self-pay | Admitting: Internal Medicine

## 2023-09-19 DIAGNOSIS — Z1231 Encounter for screening mammogram for malignant neoplasm of breast: Secondary | ICD-10-CM

## 2023-09-20 DIAGNOSIS — F411 Generalized anxiety disorder: Secondary | ICD-10-CM | POA: Diagnosis not present

## 2023-09-27 DIAGNOSIS — F411 Generalized anxiety disorder: Secondary | ICD-10-CM | POA: Diagnosis not present

## 2023-10-04 DIAGNOSIS — F411 Generalized anxiety disorder: Secondary | ICD-10-CM | POA: Diagnosis not present

## 2023-10-04 DIAGNOSIS — Z96641 Presence of right artificial hip joint: Secondary | ICD-10-CM | POA: Diagnosis not present

## 2023-10-06 ENCOUNTER — Ambulatory Visit
Admission: RE | Admit: 2023-10-06 | Discharge: 2023-10-06 | Disposition: A | Discharge status: Home / Self Care | Source: Ambulatory Visit | Attending: Internal Medicine | Admitting: Internal Medicine

## 2023-10-06 DIAGNOSIS — Z1231 Encounter for screening mammogram for malignant neoplasm of breast: Secondary | ICD-10-CM | POA: Diagnosis not present

## 2023-10-11 DIAGNOSIS — F411 Generalized anxiety disorder: Secondary | ICD-10-CM | POA: Diagnosis not present

## 2023-10-23 ENCOUNTER — Other Ambulatory Visit (INDEPENDENT_AMBULATORY_CARE_PROVIDER_SITE_OTHER): Payer: Self-pay | Admitting: Nurse Practitioner

## 2023-10-23 DIAGNOSIS — I83893 Varicose veins of bilateral lower extremities with other complications: Secondary | ICD-10-CM

## 2023-10-25 ENCOUNTER — Ambulatory Visit (INDEPENDENT_AMBULATORY_CARE_PROVIDER_SITE_OTHER): Admitting: Nurse Practitioner

## 2023-10-25 ENCOUNTER — Encounter (INDEPENDENT_AMBULATORY_CARE_PROVIDER_SITE_OTHER): Payer: Self-pay | Admitting: Nurse Practitioner

## 2023-10-25 ENCOUNTER — Ambulatory Visit (INDEPENDENT_AMBULATORY_CARE_PROVIDER_SITE_OTHER)

## 2023-10-25 VITALS — BP 125/76 | HR 75 | Wt 117.5 lb

## 2023-10-25 DIAGNOSIS — I83893 Varicose veins of bilateral lower extremities with other complications: Secondary | ICD-10-CM

## 2023-10-25 DIAGNOSIS — I83811 Varicose veins of right lower extremities with pain: Secondary | ICD-10-CM | POA: Diagnosis not present

## 2023-10-25 DIAGNOSIS — F411 Generalized anxiety disorder: Secondary | ICD-10-CM | POA: Diagnosis not present

## 2023-10-25 NOTE — Progress Notes (Signed)
 Subjective:    Patient ID: Shannon Weber, female    DOB: 07/06/1950, 73 y.o.   MRN: [id number] No chief complaint on file.   The patient presents today as a referral from Dr. Cleotilde in regards to painful lower extremity varicosities.  She has previously underwent treatment of her bilateral GSV about 30 years ago.  The patient note significant improvement in the lower extremity pain but not resolution of the symptoms as of a few months ago.. The patient notes multiple residual varicosities in the right lower extremity which continued to hurt with dependent positions and remained tender to palpation. The patient continues to wear graduated compression stockings on a daily basis but these are not eliminating the pain and discomfort. The patient continues to use over-the-counter anti-inflammatory medications to treat the pain and related symptoms but this has not given the patient relief. The patient notes the pain in the lower extremities is causing problems with daily exercise, problems at work and even with household activities such as preparing meals and doing dishes.  The patient has evidence of prior vein treatment in the bilateral lowe extremities.  She also has significant reflux noted in mid thigh varicosities (where her pain is located).  There is also a small subacute svt at the knee    Review of Systems  Cardiovascular:  Positive for leg swelling.  All other systems reviewed and are negative.      Objective:   Physical Exam Vitals reviewed.  HENT:     Head: Normocephalic.  Cardiovascular:     Rate and Rhythm: Normal rate.     Pulses: Normal pulses.  Pulmonary:     Effort: Pulmonary effort is normal.  Musculoskeletal:        General: Tenderness present.     Right lower leg: Edema present.  Skin:    General: Skin is warm and dry.  Neurological:     Mental Status: She is alert and oriented to person, place, and time.  Psychiatric:        Mood and Affect: Mood normal.         Behavior: Behavior normal.        Thought Content: Thought content normal.        Judgment: Judgment normal.     BP 125/76   Pulse 75   Wt 117 lb 8 oz (53.3 kg)   BMI 18.96 kg/m   Past Medical History:  Diagnosis Date   Abnormal serum lipase level    Atrophic vaginitis 02/21/2016   Chocolate cyst of ovary    COVID-19 07/25/2020   mild -cough, sneeze, congestion, sore throat, headache.  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)    Onychomycosis    Varicose vein of leg     Social History   Socioeconomic History   Marital status: Married    Spouse name: Ubaldo   Number of children: 2   Years of education: Not on file   Highest education level: Bachelor's degree (e.g., BA, AB, BS)  Occupational History   Occupation: Retired  Tobacco Use   Smoking status: Never   Smokeless tobacco: Never   Tobacco comments:    smoking cessation materials not required  Vaping Use   Vaping status: Never Used  Substance and Sexual Activity   Alcohol use: No    Alcohol/week: 0.0 standard drinks of alcohol  Drug use: No   Sexual activity: Not Currently  Other Topics Concern   Not on file  Social History Narrative   Not on file   Social Drivers of Health   Financial Resource Strain: Low Risk  (09/12/2023)   Received from [facility]   Overall Financial Resource Strain (CARDIA)    Difficulty of Paying Living Expenses: Not hard at all  Food Insecurity: No Food Insecurity (09/12/2023)   Received from [facility]   Hunger Vital Sign    Within the past 12 months, you worried that your food would run out before you got the money to buy more.: Never true    Within the past 12 months, the food you bought just didn't last and you didn't have money to get more.: Never true  Transportation Needs: No Transportation Needs (09/12/2023)   Received from [facility] - Transportation    In the past 12 months, has lack of transportation kept you from medical appointments or from getting medications?: No    Lack of Transportation (Non-Medical): No  Physical Activity: Insufficiently Active (09/05/2017)   Exercise Vital Sign    Days of Exercise per Week: 2 days    Minutes of Exercise per Session: 40 min  Stress: No Stress Concern Present (09/05/2017)   [employer] of Occupational Health - Occupational Stress Questionnaire    Feeling of Stress : Not at all  Social Connections: Unknown (09/05/2017)   Social Connection and Isolation Panel    Frequency of Communication with Friends and Family: Patient declined    Frequency of Social Gatherings with Friends and Family: Patient declined    Attends Religious Services: Patient declined    Database administrator or Organizations: Patient declined    Attends Banker Meetings: Patient declined    Marital Status: Married  Catering manager Violence: Not At Risk (09/05/2017)   Humiliation, Afraid, Rape, and Kick questionnaire    Fear of Current or Ex-Partner: No    Emotionally Abused: No    Physically Abused: No    Sexually Abused: No    Past Surgical History:  Procedure Laterality Date   ABDOMINAL HYSTERECTOMY     BREAST BIOPSY Left 1980's   negative   BREAST CYST ASPIRATION Right 2008   negative   BREAST EXCISIONAL BIOPSY Left 1989   benign per pt   CATARACT EXTRACTION W/PHACO Left 08/26/2020   Procedure: CATARACT EXTRACTION PHACO AND INTRAOCULAR LENS PLACEMENT (IOC) LEFT TECNIS TORIC LOW ADD 9.98 01:22.9;  Surgeon: Mittie Gaskin, MD;  Location: [facility];  Service: Ophthalmology;  Laterality: Left;   CATARACT EXTRACTION W/PHACO Right 08/10/2020   Procedure: CATARACT EXTRACTION PHACO AND INTRAOCULAR LENS PLACEMENT (IOC) RIGHT TECNIS LOW ADD 10.77 01:31.1;  Surgeon: Mittie Gaskin, MD;  Location: [facility];  Service:  Ophthalmology;  Laterality: Right;   COLONOSCOPY  2014   COLONOSCOPY W/ POLYPECTOMY     COLONOSCOPY WITH PROPOFOL  N/A 03/30/2023   Procedure: COLONOSCOPY WITH PROPOFOL ;  Surgeon: Onita Elspeth Sharper, DO;  Location: [facility] ENDOSCOPY;  Service: Gastroenterology;  Laterality: N/A;   EXTRACORPOREAL SHOCK WAVE LITHOTRIPSY Left 12/25/2014   Procedure: EXTRACORPOREAL SHOCK WAVE LITHOTRIPSY (ESWL);  Surgeon: Sharper JONELLE Burkes, MD;  Location: [facility];  Service: Urology;  Laterality: Left;   mole removed     POLYPECTOMY  03/30/2023   Procedure: POLYPECTOMY;  Surgeon: Onita Elspeth Sharper, DO;  Location: [facility] ENDOSCOPY;  Service: Gastroenterology;;   REPLACEMENT TOTAL HIP  W/  RESURFACING IMPLANTS Right 04/13/2015   TOTAL HIP ARTHROPLASTY Right [id number]    Family History  Problem Relation Age of Onset   Goiter Mother    Cancer Father        colon/lung   Hyperlipidemia Father    Heart disease Father 15   Hypertension Father    Breast cancer Maternal Aunt    Breast cancer Paternal Aunt    Bipolar disorder Brother    Alzheimer's disease Maternal Grandfather    Heart disease Paternal Grandmother     Allergies  Allergen Reactions   Lamisil [Terbinafine] Itching   Levaquin  [Levofloxacin  In D5w] Anxiety and Other (See Comments)    Had anxiety, shakey, not herself, hallucinations       Latest Ref Rng & Units 04/03/2018   10:15 AM 03/31/2017   12:23 PM 12/31/2015   11:11 AM  CBC  WBC 3.8 - 10.8 Thousand/uL 4.2  5.0  4.3   Hemoglobin 11.7 - 15.5 g/dL 86.6  85.6  86.3   Hematocrit 35.0 - 45.0 % 39.9  43.0  40.6   Platelets 140 - 400 Thousand/uL 246  217  244       CMP     Component Value Date/Time   NA 143 11/09/2020 1526   K 4.6 11/09/2020 1526   CL 104 11/09/2020 1526   CO2 26 11/09/2020 1526   GLUCOSE 76 11/09/2020 1526   GLUCOSE 61 (L) 04/03/2018 1015   BUN 23 11/09/2020 1526   CREATININE 0.63 11/09/2020 1526   CREATININE 0.60 04/03/2018 1015   CALCIUM 9.2 01/24/2022 1426    PROT 6.7 11/09/2020 1526   ALBUMIN 4.4 11/09/2020 1526   AST 17 11/09/2020 1526   ALT 11 11/09/2020 1526   ALKPHOS 55 11/09/2020 1526   BILITOT 0.3 11/09/2020 1526   EGFR 94 11/09/2020 1526   GFRNONAA 93 04/03/2018 1015     No results found.     Assessment & Plan:   1. Varicose veins of right lower extremity with pain (Primary) Recommend:  The patient has had successful ablation of the previously incompetent saphenous venous system but still has persistent symptoms of pain and swelling that are having a negative impact on daily life and daily activities.  Patient should undergo injection sclerotherapy to treat the residual varicosities.  The risks, benefits and alternative therapies were reviewed in detail with the patient.  All questions were answered.  The patient agrees to proceed with foam sclerotherapy at their convenience.  The patient will continue wearing the graduated compression stockings and using the over-the-counter pain medications to treat her symptoms.        Current Outpatient Medications on File Prior to Visit  Medication Sig Dispense Refill   busPIRone (BUSPAR) 5 MG tablet Take 5 mg by mouth 2 (two) times daily. (Patient not taking: Reported on 03/30/2023)     Cholecalciferol (VITAMIN D  PO) Take by mouth daily. Reported on 04/10/2015     citalopram  (CELEXA ) 20 MG tablet TAKE 1 TABLET DAILY (Patient not taking: Reported on 10/25/2023) 90 tablet 1   estradiol  (ESTRACE ) 0.1 MG/GM vaginal cream PLACE 1 APPLICATORFULL VAGINALLY ONCE A WEEK (REDUCE FREQUENCY TO JUST ONCE A WEEK) 42.5 g 0   JUBLIA  10 % SOLN Apply 1 drop topically daily. 8 mL 11   ketoconazole  (NIZORAL ) 2 % cream Apply 1 Application topically 2 (two) times daily. 15 g 2   Misc Natural Products (GLUCOSAMINE CHONDROITIN ADV PO) Take by mouth 2 (two) times daily. Reported on  04/10/2015     NEOMYCIN -POLYMYXIN-HYDROCORTISONE (CORTISPORIN) 1 % SOLN OTIC solution Apply 1-2 drops to toe BID after soaking 10 mL  1   TURMERIC PO Take by mouth.     venlafaxine XR (EFFEXOR-XR) 37.5 MG 24 hr capsule Take 37.5 mg by mouth daily. (Patient not taking: Reported on 03/23/2023)     Vitamin D , Ergocalciferol , (DRISDOL ) 1.25 MG (50000 UNIT) CAPS capsule Take 1 capsule (50,000 Units total) by mouth every 7 (seven) days. (Patient not taking: Reported on 03/30/2023) 8 capsule 0   No current facility-administered medications on file prior to visit.    There are no Patient Instructions on file for this visit. No follow-ups on file.   Kesler Wickham E Tinsleigh Slovacek, NP

## 2023-11-06 ENCOUNTER — Telehealth (INDEPENDENT_AMBULATORY_CARE_PROVIDER_SITE_OTHER): Payer: Self-pay | Admitting: Nurse Practitioner

## 2023-11-06 NOTE — Telephone Encounter (Signed)
 Spoke with patient regarding scheduling sclerotherapy. I advised that the provider portal where I submit prior authorizations was having technical difficulties but they called me and I gave them detailed information. They called back and gave additional information. I faxed all pertinent information to them and I have not heard back from them. I advised that as soon as I can get on the phone with them to inquire about a decision, I will be calling patient to update/schedule. Pt acknowledged.

## 2023-11-22 DIAGNOSIS — F411 Generalized anxiety disorder: Secondary | ICD-10-CM | POA: Diagnosis not present

## 2023-11-27 ENCOUNTER — Encounter (INDEPENDENT_AMBULATORY_CARE_PROVIDER_SITE_OTHER): Payer: Self-pay | Admitting: Vascular Surgery

## 2023-11-27 ENCOUNTER — Ambulatory Visit (INDEPENDENT_AMBULATORY_CARE_PROVIDER_SITE_OTHER): Admitting: Vascular Surgery

## 2023-11-27 VITALS — BP 127/78 | HR 79 | Resp 16 | Ht 66.0 in | Wt 118.8 lb

## 2023-11-27 DIAGNOSIS — I8310 Varicose veins of unspecified lower extremity with inflammation: Secondary | ICD-10-CM

## 2023-11-27 DIAGNOSIS — I8311 Varicose veins of right lower extremity with inflammation: Secondary | ICD-10-CM | POA: Diagnosis not present

## 2023-11-29 ENCOUNTER — Telehealth (INDEPENDENT_AMBULATORY_CARE_PROVIDER_SITE_OTHER): Payer: Self-pay

## 2023-11-29 NOTE — Telephone Encounter (Signed)
 Patient called into nurse line stating she was having some tenderness still after having sclero done on Monday, return call to inquire further did not reach, left her a detailed message for a return call to the office or mychart message with further information.

## 2023-11-29 NOTE — Telephone Encounter (Signed)
 Reached out to patient again she reports  that she is having a 4/10 pain level when walking and some tenderness to the touch area is not red nor is it warm. She reports she has been wearing her compression and elevating her legs when able. She reports that she has been wearing the compression around the clock day and night. Advised her it is not recommended to do this and that she should wear them during the day and remove them for sleeping. Asked if she could send in a MyChart photo of the area. She reports she will try and do so, she is in agreement to wear her compression during the day only.

## 2023-11-30 ENCOUNTER — Telehealth (INDEPENDENT_AMBULATORY_CARE_PROVIDER_SITE_OTHER): Payer: Self-pay

## 2023-11-30 ENCOUNTER — Other Ambulatory Visit (INDEPENDENT_AMBULATORY_CARE_PROVIDER_SITE_OTHER): Payer: Self-pay | Admitting: Vascular Surgery

## 2023-11-30 ENCOUNTER — Ambulatory Visit (INDEPENDENT_AMBULATORY_CARE_PROVIDER_SITE_OTHER)

## 2023-11-30 DIAGNOSIS — M79604 Pain in right leg: Secondary | ICD-10-CM

## 2023-11-30 NOTE — Telephone Encounter (Signed)
 Patient called nurse line and stated her pain in her R leg had increased from a 4/10 to 8/10 a this time. Patient stated she did not feel any heat in the leg, but it is red and very tender to the touch. Patient stated she is very concerned at this time. She is unable to send pictures via mychart at this time.   Spoke to Dr. Jama at this time in reference to patients concern and he said to bring patient in for DVT ultrasound, patient is scheduled for DVT ultrasound this afternoon 11/30/23. Called patient and made her aware of appointment she stated she had been elevating her leg and the pain is now 3/10, but she feels comfortable getting the ultrasound due to the tenderness and pain.

## 2023-12-01 ENCOUNTER — Emergency Department

## 2023-12-01 ENCOUNTER — Emergency Department
Admission: EM | Admit: 2023-12-01 | Discharge: 2023-12-01 | Disposition: A | Discharge status: Home / Self Care | Attending: Emergency Medicine | Admitting: Emergency Medicine

## 2023-12-01 ENCOUNTER — Other Ambulatory Visit: Payer: Self-pay

## 2023-12-01 DIAGNOSIS — J9801 Acute bronchospasm: Secondary | ICD-10-CM | POA: Diagnosis not present

## 2023-12-01 DIAGNOSIS — R059 Cough, unspecified: Secondary | ICD-10-CM | POA: Diagnosis present

## 2023-12-01 LAB — CBC
HCT: 41.4 % (ref 36.0–46.0)
Hemoglobin: 13.5 g/dL (ref 12.0–15.0)
MCH: 30.3 pg (ref 26.0–34.0)
MCHC: 32.6 g/dL (ref 30.0–36.0)
MCV: 93 fL (ref 80.0–100.0)
Platelets: 224 K/uL (ref 150–400)
RBC: 4.45 MIL/uL (ref 3.87–5.11)
RDW: 13.1 % (ref 11.5–15.5)
WBC: 5.1 K/uL (ref 4.0–10.5)
nRBC: 0 % (ref 0.0–0.2)

## 2023-12-01 LAB — HEPATIC FUNCTION PANEL
ALT: 16 U/L (ref 0–44)
AST: 24 U/L (ref 15–41)
Albumin: 3.7 g/dL (ref 3.5–5.0)
Alkaline Phosphatase: 48 U/L (ref 38–126)
Bilirubin, Direct: <0.1 mg/dL (ref 0.0–0.2)
Total Bilirubin: 0.4 mg/dL (ref 0.0–1.2)
Total Protein: 6.6 g/dL (ref 6.5–8.1)

## 2023-12-01 LAB — BASIC METABOLIC PANEL WITH GFR
Anion gap: 9 (ref 5–15)
BUN: 21 mg/dL (ref 8–23)
CO2: 26 mmol/L (ref 22–32)
Calcium: 9.4 mg/dL (ref 8.9–10.3)
Chloride: 102 mmol/L (ref 98–111)
Creatinine, Ser: 0.6 mg/dL (ref 0.44–1.00)
GFR, Estimated: >60 mL/min (ref >60)
Glucose, Bld: 77 mg/dL (ref 70–99)
Potassium: 3.6 mmol/L (ref 3.5–5.1)
Sodium: 137 mmol/L (ref 135–145)

## 2023-12-01 LAB — TROPONIN I (HIGH SENSITIVITY): Troponin I (High Sensitivity): 3 ng/L (ref <18)

## 2023-12-01 LAB — LIPASE, BLOOD: Lipase: 56 U/L — ABNORMAL HIGH (ref 11–51)

## 2023-12-01 MED ORDER — PANTOPRAZOLE SODIUM 40 MG PO TBEC: 40.0000 mg | DELAYED_RELEASE_TABLET | Freq: Every day | ORAL | 0 refills | Status: AC

## 2023-12-01 MED ORDER — IOHEXOL 300 MG/ML  SOLN
75.0000 mL | Freq: Once | INTRAMUSCULAR | Status: AC | PRN
  Administered 2023-12-01: 75 mL via INTRAVENOUS

## 2023-12-01 MED ORDER — ALBUTEROL SULFATE HFA 108 (90 BASE) MCG/ACT IN AERS: 2.0000 | INHALATION_SPRAY | RESPIRATORY_TRACT | 0 refills | Status: AC | PRN

## 2023-12-01 MED ORDER — PANTOPRAZOLE SODIUM 40 MG PO TBEC
40.0000 mg | DELAYED_RELEASE_TABLET | Freq: Once | ORAL | Status: AC
  Administered 2023-12-01: 40 mg via ORAL
  Filled 2023-12-01: qty 1

## 2023-12-01 MED ORDER — ALBUTEROL SULFATE (2.5 MG/3ML) 0.083% IN NEBU
2.5000 mg | INHALATION_SOLUTION | Freq: Once | RESPIRATORY_TRACT | Status: AC
  Administered 2023-12-01: 2.5 mg via RESPIRATORY_TRACT
  Filled 2023-12-01: qty 3

## 2023-12-01 NOTE — ED Provider Notes (Signed)
 [facility] Provider Note    Event Date/Time   First MD Initiated Contact with Patient 12/01/23 1816     (approximate)   History   Shortness of Breath   HPI  Shannon Weber is a 73 y.o. female with a history of hyperlipidemia, GERD, osteoarthritis, and depression who presents with shortness of breath, chest tightness, and coughing.  The patient states that around noon she ate a grilled cheese sandwich with some milk.  She felt like she choked on it, started coughing, and spit a lot of it back up.  She stated that shortly afterwards she felt better.  She has had oral liquids including some soda during the afternoon without difficulty.  However, when she started to eat dinner she took a few bites of her food and immediately had the same sensation that she might choke, so spit it up.  She states that her chest feels somewhat tight although she does not feel short of breath currently.  She denies any nausea or actual vomiting.  She reports some discomfort in her lower chest.  She has no abdominal pain.  I reviewed the past medical records.  The patient's most recent outpatient care was on 9/10 with vascular surgery for lower extremity varicosities.  Previously she was seen by her primary care provider on 7/29 for follow-up of her chronic conditions.   Physical Exam   Triage Vital Signs: ED Triage Vitals  Encounter Vitals Group     BP 12/01/23 1757 123/62     Girls Systolic BP Percentile --      Girls Diastolic BP Percentile --      Boys Systolic BP Percentile --      Boys Diastolic BP Percentile --      Pulse Rate 12/01/23 1757 79     Resp 12/01/23 1757 18     Temp 12/01/23 1757 98.2 F (36.8 C)     Temp src --      SpO2 12/01/23 1757 100 %     Weight 12/01/23 1755 119 lb (54 kg)     Height 12/01/23 1755 5' 6 (1.676 m)     Head Circumference --      Peak Flow --      Pain Score 12/01/23 1755 5     Pain Loc --      Pain Education --      Exclude from  Growth Chart --     Most recent vital signs: Vitals:   12/01/23 1830 12/01/23 2135  BP: 125/85 127/70  Pulse: 67 67  Resp: 17 18  Temp:  98.3 F (36.8 C)  SpO2: 100% 100%     General: Alert, well-appearing, no distress.  CV:  Good peripheral perfusion.  Resp:  Normal effort.  Lungs CTAB. Abd:  Nontender.  No distention.  Other:  No jaundice or scleral icterus.   ED Results / Procedures / Treatments   Labs (all labs ordered are listed, but only abnormal results are displayed) Labs Reviewed  LIPASE, BLOOD - Abnormal; Notable for the following components:      Result Value   Lipase 56 (*)    All other components within normal limits  BASIC METABOLIC PANEL WITH GFR  CBC  HEPATIC FUNCTION PANEL  TROPONIN I (HIGH SENSITIVITY)     EKG  ED ECG REPORT I, Waylon Cassis, the attending physician, personally viewed and interpreted this ECG.  Date: 12/01/2023 EKG Time: 1759 Rate: 74 Rhythm: normal sinus rhythm QRS Axis:  normal Intervals: normal ST/T Wave abnormalities: normal Narrative Interpretation: no evidence of acute ischemia    RADIOLOGY  Chest x-ray: I independently viewed and interpreted the images; there is no focal consolidation or edema   CT chest:   IMPRESSION:  1. No acute intrathoracic process.  2. Coronary artery and aortic atherosclerosis.    PROCEDURES:  Critical Care performed: No  Procedures   MEDICATIONS ORDERED IN ED: Medications  pantoprazole (PROTONIX) EC tablet 40 mg (has no administration in time range)  albuterol (PROVENTIL) (2.5 MG/3ML) 0.083% nebulizer solution 2.5 mg (has no administration in time range)  iohexol  (OMNIPAQUE ) 300 MG/ML solution 75 mL (75 mLs Intravenous Contrast Given 12/01/23 1910)     IMPRESSION / MDM / ASSESSMENT AND PLAN / ED COURSE  I reviewed the triage vital signs and the nursing notes.  73 year old female with PMH as noted above presents with persistent shortness of breath, chest tightness,  and a sensation of difficulty swallowing after a choking episode earlier today.  Now sitting and not trying to eat, she is asymptomatic.  On exam her vital signs including O2 sat are normal.  Physical exam is unremarkable for acute findings.  Lungs are clear to auscultation.  Differential diagnosis includes, but is not limited to, aspiration, pneumonia or pneumonitis, esophageal dysmotility.  I do not suspect esophageal food impaction since the patient did not have any meat or other solids that I would expect to get stuck for this amount of time.  If she just had grilled cheese, this should dissolve.  She has been tolerating p.o. liquids and it does not sound like she swallows and then has the food come back up, but rather had a sensation of shortness of breath and chest tightness when she tried to eat again.  This seems more consistent with a respiratory process.  There is no evidence of cardiac cause or of PE.  Chest x-ray is clear.  EKG is unremarkable.  We will obtain lab workup as well as a CT chest for further evaluation determine if there is any consolidation or pneumonitis that is not seen on x-ray.  Patient's presentation is most consistent with acute presentation with potential threat to life or bodily function.  The patient is on the cardiac monitor to evaluate for evidence of arrhythmia and/or significant heart rate changes.  ----------------------------------------- 11:16 PM on 12/01/2023 -----------------------------------------  CT chest is negative.  Lipase is minimally elevated but there is no clinical evidence of pancreatitis.  LFTs are normal.  BMP and CBC show no acute findings.  On reassessment, the patient has no acute symptoms.  She feels comfortable.  She is tolerating p.o.  She is stable for discharge at this time.  Overall I suspect some element of bronchospasm rather than any actual aspiration or pneumonitis, versus some esophageal irritation.  I have prescribed albuterol  as well as Protonix.  I counseled her on the results of the workup and plan of care.  I gave strict return precautions, and she expressed understanding.   FINAL CLINICAL IMPRESSION(S) / ED DIAGNOSES   Final diagnoses:  Bronchospasm     Rx / DC Orders   ED Discharge Orders          Ordered    albuterol (VENTOLIN HFA) 108 (90 Base) MCG/ACT inhaler  Every 4 hours PRN        12/01/23 2313    pantoprazole (PROTONIX) 40 MG tablet  Daily        12/01/23 2313  Note:  This document was prepared using Dragon voice recognition software and may include unintentional dictation errors.    Jacolyn Pae, MD 12/01/23 2317

## 2023-12-01 NOTE — ED Triage Notes (Addendum)
 Pt comes with c/o sob and possible aspiration. Pt states she feels like she choked on something while eating this morning. Pt stats tightness in chest and tried to eat tonight and this all started again. Pt states  it hurts to breath. Pt states hard to swallow and painful.   Pt denies any hx of this. Pt states she keeps burping.

## 2023-12-01 NOTE — Discharge Instructions (Addendum)
 We suspect that you likely had some inflammation in your lungs and spasm of your airway, versus some inflammation in your esophagus.  Use the albuterol inhaler as needed for shortness of breath or chest tightness.  Take the Protonix daily for the next 2 weeks to help decrease stomach acid.  Follow-up with your regular doctor.  In the meantime, return to the ER for new, worsening, or persistent severe shortness of breath, difficulty swallowing or eating, chest pain, or any other new or worsening symptoms that concern you.

## 2023-12-05 ENCOUNTER — Encounter (INDEPENDENT_AMBULATORY_CARE_PROVIDER_SITE_OTHER): Payer: Self-pay | Admitting: Vascular Surgery

## 2023-12-05 DIAGNOSIS — I8310 Varicose veins of unspecified lower extremity with inflammation: Secondary | ICD-10-CM | POA: Insufficient documentation

## 2023-12-05 NOTE — Progress Notes (Signed)
   Indication:  Patient presents with symptomatic varicose veins of the *** lower extremity.  Procedure:  Sclerotherapy using hypertonic saline mixed with 1% Lidocaine was performed on the *** lower extremity.  Compression wraps were placed.  The patient tolerated the procedure well.  Plan:  Follow up as needed.  

## 2023-12-25 ENCOUNTER — Ambulatory Visit (INDEPENDENT_AMBULATORY_CARE_PROVIDER_SITE_OTHER): Admitting: Vascular Surgery

## 2023-12-25 DIAGNOSIS — I8311 Varicose veins of right lower extremity with inflammation: Secondary | ICD-10-CM

## 2023-12-25 NOTE — Progress Notes (Signed)
   Indication:  Patient presents with symptomatic varicose veins of the right lower extremity.  Procedure:  Sclerotherapy using STS mixed with 1% Lidocaine  was performed on the right lower extremity.  Compression wraps were placed.  The patient tolerated the procedure well.  Plan:  Follow up as needed.

## 2023-12-31 ENCOUNTER — Encounter (INDEPENDENT_AMBULATORY_CARE_PROVIDER_SITE_OTHER): Payer: Self-pay | Admitting: Vascular Surgery

## 2024-01-15 ENCOUNTER — Encounter (INDEPENDENT_AMBULATORY_CARE_PROVIDER_SITE_OTHER): Payer: Self-pay | Admitting: Nurse Practitioner

## 2024-01-15 ENCOUNTER — Ambulatory Visit (INDEPENDENT_AMBULATORY_CARE_PROVIDER_SITE_OTHER): Admitting: Nurse Practitioner

## 2024-01-15 VITALS — BP 130/64 | HR 89 | Resp 18 | Wt 114.8 lb

## 2024-01-15 DIAGNOSIS — I8311 Varicose veins of right lower extremity with inflammation: Secondary | ICD-10-CM | POA: Diagnosis not present

## 2024-01-15 DIAGNOSIS — I8310 Varicose veins of unspecified lower extremity with inflammation: Secondary | ICD-10-CM

## 2024-01-15 NOTE — Progress Notes (Signed)
 Varicose veins of right  lower extremity with inflammation (454.1  I83.10) Current Plans   Indication: Patient presents with symptomatic varicose veins of the right  lower extremity.   Procedure: Sclerotherapy using hypertonic saline mixed with 1% Lidocaine was performed on the right lower extremity. Compression wraps were placed. The patient tolerated the procedure well.

## 2024-02-13 ENCOUNTER — Ambulatory Visit (INDEPENDENT_AMBULATORY_CARE_PROVIDER_SITE_OTHER): Admitting: Nurse Practitioner

## 2024-02-13 ENCOUNTER — Encounter (INDEPENDENT_AMBULATORY_CARE_PROVIDER_SITE_OTHER): Payer: Self-pay | Admitting: Nurse Practitioner

## 2024-02-13 VITALS — BP 105/63 | HR 84 | Resp 18 | Wt 116.0 lb

## 2024-02-13 DIAGNOSIS — I8310 Varicose veins of unspecified lower extremity with inflammation: Secondary | ICD-10-CM

## 2024-02-13 DIAGNOSIS — I8311 Varicose veins of right lower extremity with inflammation: Secondary | ICD-10-CM

## 2024-02-18 ENCOUNTER — Encounter (INDEPENDENT_AMBULATORY_CARE_PROVIDER_SITE_OTHER): Payer: Self-pay | Admitting: Nurse Practitioner

## 2024-02-18 NOTE — Progress Notes (Signed)
 Varicose veins of right  lower extremity with inflammation (454.1  I83.10) Current Plans   Indication: Patient presents with symptomatic varicose veins of the right  lower extremity.   Procedure: Sclerotherapy using hypertonic saline mixed with 1% Lidocaine was performed on the right lower extremity. Compression wraps were placed. The patient tolerated the procedure well.

## 2024-02-23 ENCOUNTER — Ambulatory Visit: Admitting: Internal Medicine

## 2024-02-27 ENCOUNTER — Ambulatory Visit: Payer: Self-pay

## 2024-02-27 NOTE — Telephone Encounter (Signed)
 FYI Only or Action Required?: FYI only for provider: Home care or UC. New patient appt not until 03/12/24.  Patient was last seen in primary care on Not yet established.  Called Nurse Triage reporting Cough.  Symptoms began several weeks ago.  Interventions attempted: Prescription medications: Atrovent, Claritin.  Symptoms are: stable.  Triage Disposition: Home Care  Patient/caregiver understands and will follow disposition?: Yes   Reason for Disposition  Cough  Answer Assessment - Initial Assessment Questions Pt has new pt appt already scheduled for 03/12/24. Pt saw pulmonolgist, Dr. Sumner on 02/21/24 for similar symptoms. Advised if symptoms worsen, restart Claritin and Atrovent. Pt has been using albuterol  inhaler. Advised pt to call pulmonologist for persistent symptoms or UC.   1. ONSET: When did the cough begin?      3 days ago  2. SPUTUM: Describe the color of your sputum (e.g., none, dry cough; clear, white, yellow, green)     Slightly yellow  3. HEMOPTYSIS: Are you coughing up any blood? If Yes, ask: How much? (e.g., flecks, streaks, tablespoons, etc.)     Denies  4. DIFFICULTY BREATHING: Are you having difficulty breathing? If Yes, ask: How bad is it? (e.g., mild, moderate, severe)      Denies  5. FEVER: Do you have a fever? If Yes, ask: What is your temperature, how was it measured, and when did it start?     Denies  6. CARDIAC HISTORY: Do you have any history of heart disease? (e.g., heart attack, congestive heart failure)      Denies  7. LUNG HISTORY: Do you have any history of lung disease?  (e.g., pulmonary embolus, asthma, emphysema)     Denies  8. OTHER SYMPTOMS: Do you have any other symptoms? (e.g., runny nose, wheezing, chest pain)       Nasal congestion, sneezing  Protocols used: Cough - Acute Productive-A-AH  Message from [place] W sent at 02/27/2024  9:30 AM EST  Reason for Triage: Cough, Sneezing, Nasal Congestion.

## 2024-03-06 ENCOUNTER — Emergency Department

## 2024-03-06 ENCOUNTER — Emergency Department
Admission: EM | Admit: 2024-03-06 | Discharge: 2024-03-06 | Disposition: A | Discharge status: Home / Self Care | Source: Ambulatory Visit

## 2024-03-06 ENCOUNTER — Encounter: Payer: Self-pay | Admitting: *Deleted

## 2024-03-06 ENCOUNTER — Other Ambulatory Visit: Payer: Self-pay

## 2024-03-06 DIAGNOSIS — R079 Chest pain, unspecified: Secondary | ICD-10-CM | POA: Insufficient documentation

## 2024-03-06 LAB — BASIC METABOLIC PANEL WITH GFR
Anion gap: 10 (ref 5–15)
BUN: 18 mg/dL (ref 8–23)
CO2: 25 mmol/L (ref 22–32)
Calcium: 9 mg/dL (ref 8.9–10.3)
Chloride: 102 mmol/L (ref 98–111)
Creatinine, Ser: 0.67 mg/dL (ref 0.44–1.00)
GFR, Estimated: >60 mL/min
Glucose, Bld: 91 mg/dL (ref 70–99)
Potassium: 3.9 mmol/L (ref 3.5–5.1)
Sodium: 137 mmol/L (ref 135–145)

## 2024-03-06 LAB — CBC
HCT: 40.1 % (ref 36.0–46.0)
Hemoglobin: 13 g/dL (ref 12.0–15.0)
MCH: 30.2 pg (ref 26.0–34.0)
MCHC: 32.4 g/dL (ref 30.0–36.0)
MCV: 93.3 fL (ref 80.0–100.0)
Platelets: 288 K/uL (ref 150–400)
RBC: 4.3 MIL/uL (ref 3.87–5.11)
RDW: 12.9 % (ref 11.5–15.5)
WBC: 7.9 K/uL (ref 4.0–10.5)
nRBC: 0 % (ref 0.0–0.2)

## 2024-03-06 LAB — TROPONIN T, HIGH SENSITIVITY
Troponin T High Sensitivity: 7 ng/L (ref 0–19)
Troponin T High Sensitivity: 7 ng/L (ref 0–19)

## 2024-03-06 MED ORDER — IOHEXOL 300 MG/ML  SOLN
75.0000 mL | Freq: Once | INTRAMUSCULAR | Status: AC | PRN
  Administered 2024-03-06: 75 mL via INTRAVENOUS

## 2024-03-06 MED ORDER — ASPIRIN 81 MG PO CHEW
324.0000 mg | CHEWABLE_TABLET | Freq: Once | ORAL | Status: AC
  Administered 2024-03-06: 324 mg via ORAL
  Filled 2024-03-06: qty 4

## 2024-03-06 NOTE — ED Provider Notes (Signed)
 "  [facility] Provider Note    Event Date/Time   First MD Initiated Contact with Patient 03/06/24 1817     (approximate)   History   Chest Pain   HPI  Shannon Weber is a 74 y.o. female with a past medical history of high cholesterol presenting to the emergency department complaining of chest pain.  Patient reports that she has been sick for the last 10 days with respiratory symptoms but that the chest pain has worsened over the last 2.  [facility] and was told to come here for further evaluation.  She reports that at the time she was told that she had a fever which she did not know.  She reports chest tightness, chest congestion, and nasal congestion but denies any shortness of breath.     Physical Exam   Triage Vital Signs: ED Triage Vitals [03/06/24 1707]  Encounter Vitals Group     BP 113/64     Girls Systolic BP Percentile      Girls Diastolic BP Percentile      Boys Systolic BP Percentile      Boys Diastolic BP Percentile      Pulse Rate 87     Resp 18     Temp 98.7 F (37.1 C)     Temp Source Oral     SpO2 100 %     Weight 110 lb (49.9 kg)     Height 5' 6 (1.676 m)     Head Circumference      Peak Flow      Pain Score 4     Pain Loc      Pain Education      Exclude from Growth Chart     Most recent vital signs: Vitals:   03/06/24 1707 03/06/24 2009  BP: 113/64 (!) 130/58  Pulse: 87 73  Resp: 18 18  Temp: 98.7 F (37.1 C) 98.3 F (36.8 C)  SpO2: 100% 97%     General: Awake, no distress.  CV:  Good peripheral perfusion.  Resp:  Normal effort.  Abd:  No distention.  Other:     ED Results / Procedures / Treatments   Labs (all labs ordered are listed, but only abnormal results are displayed) Labs Reviewed  BASIC METABOLIC PANEL WITH GFR  CBC  TROPONIN T, HIGH SENSITIVITY  TROPONIN T, HIGH SENSITIVITY     EKG  ED ECG REPORT I, Reche CHRISTELLA Leventhal, the attending physician, personally viewed and  interpreted this ECG.  Date: 03/06/2024 Rate: 81 bpm Rhythm: normal sinus rhythm QRS Axis: normal Intervals: normal ST/T Wave abnormalities: normal Narrative Interpretation: no evidence of acute ischemia    RADIOLOGY I, Reche CHRISTELLA Leventhal, personally viewed and evaluated these images (plain radiographs) as part of my medical decision making, as well as reviewing the written report by the radiologist.  Chest x-ray was unremarkable.  Chest CT: IMPRESSION: 1. No acute cardiopulmonary disease. 2. Aortic atherosclerosis.   PROCEDURES:  Critical Care performed: No  Procedures   MEDICATIONS ORDERED IN ED: Medications - No data to display   IMPRESSION / MDM / ASSESSMENT AND PLAN / ED COURSE  I reviewed the triage vital signs and the nursing notes.                              Differential diagnosis includes, but is not limited to, ACS, aortic dissection, pulmonary embolism,  cardiac tamponade, pneumothorax, pneumonia, pericarditis, myocarditis, GI-related causes including esophagitis/gastritis, and musculoskeletal chest wall pain.     Patient's presentation is most consistent with acute illness / injury with system symptoms.  Patient is a 74 year old female with past medical history of hyperlipidemia presenting to the emergency department for worsening chest pain.  Chest x-ray and lab work was unremarkable.  Given patient symptoms and her concern for pneumonia CT of the chest was performed which again was unremarkable.  While CT scan was pending the patient did have 1 episode where she began to feel very flushed and warm.  EKG was performed and was unremarkable.  Troponins were 7 and 7  Discussed results at length with the patient.  Discussed plan for discharge with instructions to follow-up with her primary care physician within the next few days.  Return to the emergency department for new or worsening symptoms.      FINAL CLINICAL IMPRESSION(S) / ED DIAGNOSES    Final diagnoses:  Nonspecific chest pain     Rx / DC Orders   ED Discharge Orders     None        Note:  This document was prepared using Dragon voice recognition software and may include unintentional dictation errors.   [place] Reche HERO, MD 03/06/24 2148  "

## 2024-03-06 NOTE — ED Notes (Signed)
 Pt called out reporting headache with hot sensation. [place], MD aware.

## 2024-03-06 NOTE — ED Triage Notes (Signed)
 Pt to triage via wheelchair.  Pt sent from [facility] for eval of chest pain, fever and recent resp illness.  Pt has chest tightness.  pt denies sob.  No n/v  pt alert  speech clear.

## 2024-03-06 NOTE — ED Triage Notes (Signed)
 First nurse note: Pt to ED via POV from [facility]. KC reports 2 days of exertional CP. Respiratory symptoms since the 11th. Low grade temp of 100.0

## 2024-03-12 ENCOUNTER — Ambulatory Visit: Payer: Self-pay | Admitting: Internal Medicine

## 2024-03-12 ENCOUNTER — Ambulatory Visit: Admitting: Internal Medicine

## 2024-03-12 ENCOUNTER — Encounter: Payer: Self-pay | Admitting: Internal Medicine

## 2024-03-12 VITALS — BP 120/70 | HR 101 | Temp 98.0°F | Ht 66.0 in | Wt 110.6 lb

## 2024-03-12 DIAGNOSIS — G479 Sleep disorder, unspecified: Secondary | ICD-10-CM

## 2024-03-12 DIAGNOSIS — R739 Hyperglycemia, unspecified: Secondary | ICD-10-CM | POA: Diagnosis not present

## 2024-03-12 DIAGNOSIS — F330 Major depressive disorder, recurrent, mild: Secondary | ICD-10-CM | POA: Diagnosis not present

## 2024-03-12 DIAGNOSIS — R413 Other amnesia: Secondary | ICD-10-CM | POA: Diagnosis not present

## 2024-03-12 DIAGNOSIS — E7800 Pure hypercholesterolemia, unspecified: Secondary | ICD-10-CM

## 2024-03-12 DIAGNOSIS — Z800 Family history of malignant neoplasm of digestive organs: Secondary | ICD-10-CM

## 2024-03-12 DIAGNOSIS — Z136 Encounter for screening for cardiovascular disorders: Secondary | ICD-10-CM

## 2024-03-12 DIAGNOSIS — R3129 Other microscopic hematuria: Secondary | ICD-10-CM

## 2024-03-12 DIAGNOSIS — Z860101 Personal history of adenomatous and serrated colon polyps: Secondary | ICD-10-CM

## 2024-03-12 DIAGNOSIS — K219 Gastro-esophageal reflux disease without esophagitis: Secondary | ICD-10-CM

## 2024-03-12 LAB — LIPID PANEL
Cholesterol: 238 mg/dL — ABNORMAL HIGH (ref 28–200)
HDL: 69.9 mg/dL
LDL Cholesterol: 154 mg/dL — ABNORMAL HIGH (ref 10–99)
NonHDL: 167.66
Total CHOL/HDL Ratio: 3
Triglycerides: 70 mg/dL (ref 10.0–149.0)
VLDL: 14 mg/dL (ref 0.0–40.0)

## 2024-03-12 LAB — COMPREHENSIVE METABOLIC PANEL WITH GFR
ALT: 17 U/L (ref 3–35)
AST: 23 U/L (ref 5–37)
Albumin: 4.4 g/dL (ref 3.5–5.2)
Alkaline Phosphatase: 62 U/L (ref 39–117)
BUN: 13 mg/dL (ref 6–23)
CO2: 31 meq/L (ref 19–32)
Calcium: 9.4 mg/dL (ref 8.4–10.5)
Chloride: 102 meq/L (ref 96–112)
Creatinine, Ser: 0.6 mg/dL (ref 0.40–1.20)
GFR: 87.71 mL/min
Glucose, Bld: 94 mg/dL (ref 70–99)
Potassium: 3.9 meq/L (ref 3.5–5.1)
Sodium: 139 meq/L (ref 135–145)
Total Bilirubin: 0.5 mg/dL (ref 0.2–1.2)
Total Protein: 7.2 g/dL (ref 6.0–8.3)

## 2024-03-12 LAB — TSH: TSH: 0.59 u[IU]/mL (ref 0.35–5.50)

## 2024-03-12 LAB — HEMOGLOBIN A1C: Hgb A1c MFr Bld: 5.8 % (ref 4.6–6.5)

## 2024-03-12 NOTE — Progress Notes (Signed)
 "  Subjective:    Patient ID: Shannon Weber, female    DOB: Jul 11, 1950, 74 y.o.   MRN: [id number]  Patient here for  Chief Complaint  Patient presents with   Establish Care    HPI Here to establish care. Has been seeing Dr Oneil Pinal. Per review, recently has been having problems with LPR/aspiration. Evaluated in ED 11/2023 - chest tightness and coughing. PTA - choked on food - persistent sob, chest tightness and sensation of difficulty swallowing. CXR - clear. EKG - unremarkable. CT chest- negative. Prescribed protonix  and albuterol . F/u with PCP 12/14/23 - placed on augmentin . ENT 12/27/23. Pulmonary evaluation 01/16/24 - persistent cough following food aspiration. Diagnosed - upper airway cough syndrome. Intermittent cough - likely due to PN drainage. PFTs ordered. Prescribed atroven naal spray and claritin. Also - prescribed low dose protonix . Had pulmonary f/u 02/21/24 - atrovent helping PN drainage. PFTs normal. Reported allergy testing - ok. Recommended to continue claritin and atrovent nasal spray and start singulair. Recommended sleep study. Was seen ED 03/06/24 - chest pain. CXR - unremarkable. CT chest - unremarkable. Seeing [facility] - sclerotherapy - varicose veins. On zoloft , cognitive behavioral therapy. Per 09/12/23 - note - [facility] - recommended to start aricept. She saw her acupuncturist and experienced improvement - in symptoms after treatment.  Reports some indigestion/?reflux - abdominal discomfort. Will notice occasional full sensation - walking. Increased stress. Discussed. Some better. Mother - assisted living now. Takes trazodone and magnesium glycinate and has been to sleep therapy - to help her sleep. Will wake up at 4:30 am. Hard to get back to sleep.    Past Medical History:  Diagnosis Date   Abnormal serum lipase level    Atrophic vaginitis 02/21/2016   Chocolate cyst of ovary    COVID-19 07/25/2020   mild -cough, sneeze, congestion, sore throat, headache.  Resolved 07/27/20    Fibroid    Hypercholesteremia    Inflammatory polyps    Gall bladder (2)   Major depressive disorder in remission    Onychomycosis    Varicose vein of leg    Past Surgical History:  Procedure Laterality Date   ABDOMINAL HYSTERECTOMY     BREAST BIOPSY Left 1980's   negative   BREAST CYST ASPIRATION Right 2008   negative   BREAST EXCISIONAL BIOPSY Left 1989   benign per pt   CATARACT EXTRACTION W/PHACO Left 08/26/2020   Procedure: CATARACT EXTRACTION PHACO AND INTRAOCULAR LENS PLACEMENT (IOC) LEFT TECNIS TORIC LOW ADD 9.98 01:22.9;  Surgeon: Mittie Gaskin, MD;  Location: [facility];  Service: Ophthalmology;  Laterality: Left;   CATARACT EXTRACTION W/PHACO Right 08/10/2020   Procedure: CATARACT EXTRACTION PHACO AND INTRAOCULAR LENS PLACEMENT (IOC) RIGHT TECNIS LOW ADD 10.77 01:31.1;  Surgeon: Mittie Gaskin, MD;  Location: [facility];  Service: Ophthalmology;  Laterality: Right;   COLONOSCOPY  2014   COLONOSCOPY W/ POLYPECTOMY     COLONOSCOPY WITH PROPOFOL  N/A 03/30/2023   Procedure: COLONOSCOPY WITH PROPOFOL ;  Surgeon: Onita Elspeth Sharper, DO;  Location: [facility] ENDOSCOPY;  Service: Gastroenterology;  Laterality: N/A;   EXTRACORPOREAL SHOCK WAVE LITHOTRIPSY Left 12/25/2014   Procedure: EXTRACORPOREAL SHOCK WAVE LITHOTRIPSY (ESWL);  Surgeon: Sharper JONELLE Burkes, MD;  Location: [facility] ORS;  Service: Urology;  Laterality: Left;   mole removed     POLYPECTOMY  03/30/2023   Procedure: POLYPECTOMY;  Surgeon: Onita Elspeth Sharper, DO;  Location: [facility] ENDOSCOPY;  Service: Gastroenterology;;   REPLACEMENT TOTAL HIP W/  RESURFACING IMPLANTS Right 04/13/2015   TOTAL  HIP ARTHROPLASTY Right [id number]   Family History  Problem Relation Age of Onset   Goiter Mother    Cancer Father        colon/lung   Hyperlipidemia Father    Heart disease Father 10   Hypertension Father    Breast cancer Maternal Aunt    Breast cancer Paternal Aunt    Bipolar disorder Brother     Alzheimer's disease Maternal Grandfather    Heart disease Paternal Grandmother    Social History   Socioeconomic History   Marital status: Married    Spouse name: Ubaldo   Number of children: 2   Years of education: Not on file   Highest education level: Bachelor's degree (e.g., BA, AB, BS)  Occupational History   Occupation: Retired  Tobacco Use   Smoking status: Never   Smokeless tobacco: Never   Tobacco comments:    smoking cessation materials not required  Vaping Use   Vaping status: Never Used  Substance and Sexual Activity   Alcohol use: No    Alcohol/week: 0.0 standard drinks of alcohol   Drug use: No   Sexual activity: Not Currently  Other Topics Concern   Not on file  Social History Narrative   Not on file   Social Drivers of Health   Tobacco Use: Low Risk (03/17/2024)   Patient History    Smoking Tobacco Use: Never    Smokeless Tobacco Use: Never    Passive Exposure: Not on file  Financial Resource Strain: Low Risk  (09/12/2023)   Received from [facility]   Overall Financial Resource Strain (CARDIA)    Difficulty of Paying Living Expenses: Not hard at all  Food Insecurity: No Food Insecurity (01/01/2024)   Received from [facility]   Epic    Within the past 12 months, you worried that your food would run out before you got the money to buy more.: Never true    Within the past 12 months, the food you bought just didn't last and you didn't have money to get more.: Never true  Transportation Needs: No Transportation Needs (01/01/2024)   Received from [facility] - Transportation    In the past 12 months, has lack of transportation kept you from medical appointments or from getting medications?: No    Lack of Transportation (Non-Medical): No  Physical Activity: Not on file  Stress: Not on file  Social Connections: Not on file  Depression (PHQ2-9): Low Risk (03/12/2024)   Depression (PHQ2-9)     PHQ-2 Score: 3  Alcohol Screen: Not on file  Housing: Low Risk  (01/01/2024)   Received from [facility]   Epic    In the last 12 months, was there a time when you were not able to pay the mortgage or rent on time?: No    In the past 12 months, how many times have you moved where you were living?: 0    At any time in the past 12 months, were you homeless or living in a shelter (including now)?: No  Utilities: Not At Risk (01/01/2024)   Received from [facility]   Epic    In the past 12 months has the electric, gas, oil, or water company threatened to shut off services in your home?: No  Health Literacy: Not on file     Review of Systems  Constitutional:  Negative for appetite change and fever.  HENT:  Positive  for congestion and postnasal drip.   Respiratory:  Negative for cough, chest tightness and shortness of breath.   Cardiovascular:  Negative for chest pain, palpitations and leg swelling.  Gastrointestinal:        Abdominal discomfort and indigesion - as outlined.   Genitourinary:  Negative for difficulty urinating and dysuria.  Musculoskeletal:  Negative for joint swelling and myalgias.  Skin:  Negative for color change and rash.  Neurological:  Negative for dizziness and headaches.  Psychiatric/Behavioral:         Increased stress as outlined. Some anxiety.        Objective:     BP 120/70   Pulse (!) 101   Temp 98 F (36.7 C) (Oral)   Ht 5' 6 (1.676 m)   Wt 110 lb 9.6 oz (50.2 kg)   SpO2 97%   BMI 17.85 kg/m  Wt Readings from Last 3 Encounters:  03/12/24 110 lb 9.6 oz (50.2 kg)  03/06/24 110 lb (49.9 kg)  02/13/24 116 lb (52.6 kg)    Physical Exam Vitals reviewed.  Constitutional:      General: She is not in acute distress.    Appearance: Normal appearance.  HENT:     Head: Normocephalic and atraumatic.     Right Ear: External ear normal.     Left Ear: External ear normal.  Eyes:     General: No scleral icterus.        Right eye: No discharge.        Left eye: No discharge.     Conjunctiva/sclera: Conjunctivae normal.  Neck:     Thyroid : No thyromegaly.  Cardiovascular:     Rate and Rhythm: Normal rate and regular rhythm.  Pulmonary:     Effort: No respiratory distress.     Breath sounds: Normal breath sounds. No wheezing.  Abdominal:     General: Bowel sounds are normal.     Palpations: Abdomen is soft.     Tenderness: There is no abdominal tenderness.  Musculoskeletal:        General: No swelling or tenderness.     Cervical back: Neck supple. No tenderness.  Lymphadenopathy:     Cervical: No cervical adenopathy.  Skin:    Findings: No erythema or rash.  Neurological:     Mental Status: She is alert.  Psychiatric:        Mood and Affect: Mood normal.        Behavior: Behavior normal.         Outpatient Encounter Medications as of 03/12/2024  Medication Sig   albuterol  (VENTOLIN  HFA) 108 (90 Base) MCG/ACT inhaler Inhale 2 puffs into the lungs every 4 (four) hours as needed.   Cholecalciferol (VITAMIN D  PO) Take by mouth daily. Reported on 04/10/2015   donepezil (ARICEPT) 5 MG tablet Take 5 mg by mouth.   estradiol  (ESTRACE ) 0.1 MG/GM vaginal cream PLACE 1 APPLICATORFULL VAGINALLY ONCE A WEEK (REDUCE FREQUENCY TO JUST ONCE A WEEK)   ipratropium (ATROVENT) 0.03 % nasal spray Place 1 spray into the nose.   JUBLIA  10 % SOLN Apply 1 drop topically daily.   ketoconazole  (NIZORAL ) 2 % cream Apply 1 Application topically 2 (two) times daily.   Misc Natural Products (GLUCOSAMINE CHONDROITIN ADV PO) Take by mouth 2 (two) times daily. Reported on 04/10/2015   montelukast (SINGULAIR) 10 MG tablet Take 10 mg by mouth at bedtime.   NEOMYCIN -POLYMYXIN-HYDROCORTISONE (CORTISPORIN) 1 % SOLN OTIC solution Apply 1-2 drops to toe BID after soaking  pantoprazole  (PROTONIX ) 40 MG tablet Take 1 tablet (40 mg total) by mouth daily for 15 days.   sertraline  (ZOLOFT ) 25 MG tablet Take 25 mg by mouth.    traZODone (DESYREL) 50 MG tablet Take 50 mg by mouth at bedtime.   TURMERIC PO Take by mouth.   [DISCONTINUED] busPIRone (BUSPAR) 5 MG tablet Take 5 mg by mouth 2 (two) times daily. (Patient not taking: Reported on 02/13/2024)   [DISCONTINUED] citalopram  (CELEXA ) 20 MG tablet TAKE 1 TABLET DAILY (Patient not taking: Reported on 02/13/2024)   [DISCONTINUED] venlafaxine XR (EFFEXOR-XR) 37.5 MG 24 hr capsule Take 37.5 mg by mouth daily. (Patient not taking: Reported on 02/13/2024)   [DISCONTINUED] Vitamin D , Ergocalciferol , (DRISDOL ) 1.25 MG (50000 UNIT) CAPS capsule Take 1 capsule (50,000 Units total) by mouth every 7 (seven) days. (Patient not taking: Reported on 02/13/2024)   No facility-administered encounter medications on file as of 03/12/2024.     Lab Results  Component Value Date   WBC 7.9 03/06/2024   HGB 13.0 03/06/2024   HCT 40.1 03/06/2024   PLT 288 03/06/2024   GLUCOSE 94 03/12/2024   CHOL 238 (H) 03/12/2024   TRIG 70.0 03/12/2024   HDL 69.90 03/12/2024   LDLCALC 154 (H) 03/12/2024   ALT 17 03/12/2024   AST 23 03/12/2024   NA 139 03/12/2024   K 3.9 03/12/2024   CL 102 03/12/2024   CREATININE 0.60 03/12/2024   BUN 13 03/12/2024   CO2 31 03/12/2024   TSH 0.59 03/12/2024   HGBA1C 5.8 03/12/2024    CT Chest W Contrast Result Date: 03/06/2024 EXAM: CT CHEST WITH CONTRAST 03/06/2024 09:17:13 PM TECHNIQUE: CT of the chest was performed with the administration of intravenous contrast. Multiplanar reformatted images are provided for review. Automated exposure control, iterative reconstruction, and/or weight based adjustment of the mA/kV was utilized to reduce the radiation dose to as low as reasonably achievable. COMPARISON: 12/01/2023 and chest x-ray today. CLINICAL HISTORY: SOB, fever, concern for pna. FINDINGS: MEDIASTINUM: Nodular density in the mediastinum posterior right apex measures 6 mm, stable most compatible with scarring. Heart and pericardium are unremarkable. The  central airways are clear. Aortic atherosclerosis. LYMPH NODES: No mediastinal, hilar or axillary lymphadenopathy. LUNGS AND PLEURA: No focal consolidation or pulmonary edema. No pleural effusion or pneumothorax. SOFT TISSUES/BONES: No acute abnormality of the bones or soft tissues. UPPER ABDOMEN: Limited images of the upper abdomen demonstrates no acute abnormality. IMPRESSION: 1. No acute cardiopulmonary disease. 2. Aortic atherosclerosis. Electronically signed by: Franky Crease MD 03/06/2024 09:25 PM EST RP Workstation: [id number]   DG Chest 2 View Result Date: 03/06/2024 EXAM: 2 VIEW(S) XRAY OF THE CHEST 03/06/2024 05:30:00 PM COMPARISON: 12/01/2023 CLINICAL HISTORY: chest pain chest pain chest pain chest pain chest pain FINDINGS: LUNGS AND PLEURA: No focal pulmonary opacity. No pleural effusion. No pneumothorax. HEART AND MEDIASTINUM: No acute abnormality of the cardiac and mediastinal silhouettes. BONES AND SOFT TISSUES: Mild midthoracic dextroscoliosis. IMPRESSION: 1. No acute findings. Electronically signed by: Greig Pique MD 03/06/2024 05:45 PM EST RP Workstation: [id number]       Assessment & Plan:  Hyperglycemia Assessment & Plan: Follow met b and A1c.   Orders: -     Hemoglobin A1c  Hypercholesterolemia Assessment & Plan: Check lipid panel. Discussed calcium  score. Request order for CT calcium  score.   Orders: -     Comprehensive metabolic panel with GFR -     TSH -     Lipid panel  Encounter for screening for coronary artery  disease Assessment & Plan: Discussed cholesterol. Discussed CT calcium  score. Request scan.   Orders: -     CT CARDIAC SCORING (SELF PAY ONLY); Future  Memory difficulties Assessment & Plan:  On zoloft , cognitive behavioral therapy. Per 09/12/23 - note - [facility] - recommended to start aricept.    Major depressive disorder, recurrent episode, mild (HCC) Assessment & Plan: Continue zoloft . Stress is some better - mother in assisted living now. Follow.     Hx of adenomatous colonic polyps Assessment & Plan: Colonoscopy - 03/2023.    Hematuria, microscopic Assessment & Plan: After reviewing chart, previous documentation - hematuria. Will need recheck urine to confirm clear.    Gastroesophageal reflux disease without esophagitis Assessment & Plan: With indigestion and symptoms as outlined, protonix  40mg  q day. Follow. Schedule soon f/u.    Family history of colon cancer Assessment & Plan: Father colon cancer. Colonoscopy 03/2023.    Sleep difficulties Assessment & Plan: Taking trazodone and magnesium glycinate. Sleep therapy. No change in medication. Obtain results of sleep study. Follow.       Allena Hamilton, MD "

## 2024-03-13 ENCOUNTER — Other Ambulatory Visit: Payer: Self-pay | Admitting: Internal Medicine

## 2024-03-13 DIAGNOSIS — E7800 Pure hypercholesterolemia, unspecified: Secondary | ICD-10-CM

## 2024-03-13 MED ORDER — ROSUVASTATIN CALCIUM 10 MG PO TABS: 10.0000 mg | ORAL_TABLET | Freq: Every day | ORAL | 2 refills | Status: AC

## 2024-03-13 NOTE — Progress Notes (Signed)
 Order placed for future lab - liver panel ordered.

## 2024-03-13 NOTE — Progress Notes (Signed)
 Rx sent in for crestor  10mg . See result note.

## 2024-03-14 NOTE — Telephone Encounter (Unsigned)
 Copied from CRM #[id number]. Topic: Clinical - Prescription Issue >> Mar 13, 2024  2:59 PM Chasity T wrote: Reason for CRM: Patient has called to get an update on cholesterol medication that was discussed with her on yesterday. The pharmacy used is   [facility] - [place], [place] - [address] [place] [address] Phone: [phone] Fax: [phone]

## 2024-03-15 ENCOUNTER — Telehealth: Payer: Self-pay

## 2024-03-15 NOTE — Telephone Encounter (Signed)
 Copied from CRM #[id number]. Topic: Clinical - Medication Question >> Mar 15, 2024  8:18 AM Revonda D wrote: Reason for CRM: Pt stated that she is experiencing heart burns and indigestion and wants to know what Dr.Scott recommends or of she can prescribe a medication for her to take. Pt would like a callback today with an update.

## 2024-03-15 NOTE — Telephone Encounter (Signed)
 Patient stayed she feels better today the pain an discomfort was last night started about 3 hours after dinner had  a salad romaine lettuce and cherry tomatoes , steak and potatoes. Patient described pain as to what she thought was gas pains and felt some relied with belching but not entirely. When patient awoke this morning she was better and had a full bowel movement this AM and stated has daily bowel movement brown in color, and normal in consistency not hard but not soft.  I advised if pains became back or severe , developed nausea or vomiting would need to be seen patient agreed. Wanted to know what she could take over the counter with the meds she takes for gas but was scared to do on her own.

## 2024-03-15 NOTE — Telephone Encounter (Signed)
 I saw her as a new pt this week. This sounds like a change from what she was describing - symptoms worsening.  Previously had described increased acid reflux. I just started her on protonix . She can add pepcid in the evening, but this sounds different. She is having more abdominal discomfort now. Please confirm she is having regular bowel movements. If increased pain, etc - needs to be evaluated today to confirm nothing more acute going on.

## 2024-03-15 NOTE — Telephone Encounter (Signed)
 Second attempt made to contact patient left message with DPR  ( Husband , Ubaldo) to have patient call office ask to speak to Nathanel COWARD please transfer to [employer].

## 2024-03-15 NOTE — Telephone Encounter (Signed)
 Spoke with patient and gave advice as the provider prescribed. Patient voiced understanding and was relieved to find out she can take Tums.

## 2024-03-15 NOTE — Telephone Encounter (Signed)
 It is ok to take gas x or tums with the protonix . Let me know if she needs anything more.

## 2024-03-15 NOTE — Telephone Encounter (Signed)
 Left Message for patient to return call [id number] please transfer to office Practice Admin.

## 2024-03-15 NOTE — Telephone Encounter (Signed)
 Copied from CRM #[id number]. Topic: Clinical - Medication Question >> Mar 15, 2024  8:18 AM Revonda D wrote: Reason for CRM: Pt stated that she is experiencing heart burns and indigestion and wants to know what Dr.Scott recommends or of she can prescribe a medication for her to take. Pt would like a callback today with an update. >> Mar 15, 2024  8:53 AM Robinson H wrote: Patient calling back to speak to possibly Daijah, not sure who she just was speaking to but states she forgot to add something to tell the person something else and wants to talk to them again.  [place] [phone]

## 2024-03-17 ENCOUNTER — Encounter: Payer: Self-pay | Admitting: Internal Medicine

## 2024-03-17 DIAGNOSIS — Z136 Encounter for screening for cardiovascular disorders: Secondary | ICD-10-CM | POA: Insufficient documentation

## 2024-03-17 DIAGNOSIS — Z800 Family history of malignant neoplasm of digestive organs: Secondary | ICD-10-CM | POA: Insufficient documentation

## 2024-03-17 DIAGNOSIS — G479 Sleep disorder, unspecified: Secondary | ICD-10-CM | POA: Insufficient documentation

## 2024-03-17 NOTE — Assessment & Plan Note (Signed)
"   On zoloft , cognitive behavioral therapy. Per 09/12/23 - note - [facility] - recommended to start aricept.  "

## 2024-03-17 NOTE — Assessment & Plan Note (Signed)
 After reviewing chart, previous documentation - hematuria. Will need recheck urine to confirm clear.

## 2024-03-17 NOTE — Assessment & Plan Note (Signed)
 Check lipid panel. Discussed calcium  score. Request order for CT calcium  score.

## 2024-03-17 NOTE — Assessment & Plan Note (Signed)
 Taking trazodone and magnesium glycinate. Sleep therapy. No change in medication. Obtain results of sleep study. Follow.

## 2024-03-17 NOTE — Assessment & Plan Note (Signed)
Colonoscopy 03/2023

## 2024-03-17 NOTE — Assessment & Plan Note (Signed)
 With indigestion and symptoms as outlined, protonix  40mg  q day. Follow. Schedule soon f/u.

## 2024-03-17 NOTE — Assessment & Plan Note (Addendum)
 Father colon cancer. Colonoscopy 03/2023.

## 2024-03-17 NOTE — Assessment & Plan Note (Signed)
 Continue zoloft . Stress is some better - mother in assisted living now. Follow.

## 2024-03-17 NOTE — Assessment & Plan Note (Signed)
 Discussed cholesterol. Discussed CT calcium  score. Request scan.

## 2024-03-17 NOTE — Assessment & Plan Note (Signed)
 Follow met b and A1c.

## 2024-03-19 ENCOUNTER — Telehealth: Payer: Self-pay | Admitting: *Deleted

## 2024-03-20 NOTE — Telephone Encounter (Signed)
 New pt to me. In reviewing, it was recommended in 08/2023 for her to start aricept - to help with cognitive issues. Confirm taking?  Also, see if agreeable to referral to neurology for evaluation and treatment.

## 2024-03-21 NOTE — Telephone Encounter (Signed)
 Patient stated she is taking the Aricept daily  and as directed. As to neurology referral patient stated she would get back to us  she did not know about seeing neurology.To let her think about it.

## 2024-03-26 ENCOUNTER — Ambulatory Visit (INDEPENDENT_AMBULATORY_CARE_PROVIDER_SITE_OTHER): Admitting: Nurse Practitioner

## 2024-04-02 ENCOUNTER — Ambulatory Visit: Admitting: Internal Medicine

## 2024-04-18 ENCOUNTER — Other Ambulatory Visit
